# Patient Record
Sex: Female | Born: 1980 | Race: White | Hispanic: No | Marital: Married | Smoking: Never smoker
Health system: Southern US, Community
[De-identification: ages and names within clinical notes are randomized; demographics above are authoritative.]

## PROBLEM LIST (undated history)

## (undated) DIAGNOSIS — J45909 Unspecified asthma, uncomplicated: Secondary | ICD-10-CM

## (undated) DIAGNOSIS — I10 Essential (primary) hypertension: Secondary | ICD-10-CM

## (undated) DIAGNOSIS — M5412 Radiculopathy, cervical region: Secondary | ICD-10-CM

## (undated) DIAGNOSIS — E669 Obesity, unspecified: Secondary | ICD-10-CM

## (undated) DIAGNOSIS — G8929 Other chronic pain: Secondary | ICD-10-CM

## (undated) HISTORY — DX: Other chronic pain: G89.29

## (undated) HISTORY — DX: Essential (primary) hypertension: I10

## (undated) HISTORY — DX: Obesity, unspecified: E66.9

## (undated) HISTORY — DX: Radiculopathy, cervical region: M54.12

## (undated) HISTORY — DX: Unspecified asthma, uncomplicated: J45.909

---

## 2000-03-02 HISTORY — PX: CHOLECYSTECTOMY: SHX55

## 2002-02-01 ENCOUNTER — Encounter: Payer: Self-pay | Admitting: *Deleted

## 2002-02-01 ENCOUNTER — Encounter: Admission: RE | Admit: 2002-02-01 | Discharge: 2002-02-01 | Payer: Self-pay | Admitting: *Deleted

## 2010-01-23 ENCOUNTER — Encounter: Payer: Self-pay | Admitting: Neurology

## 2010-07-26 ENCOUNTER — Ambulatory Visit
Admission: RE | Admit: 2010-07-26 | Discharge: 2010-07-26 | Disposition: A | Discharge status: Home / Self Care | Payer: Medicare Other | Source: Ambulatory Visit | Attending: Specialist | Admitting: Specialist

## 2010-07-26 ENCOUNTER — Other Ambulatory Visit: Payer: Self-pay | Admitting: Specialist

## 2010-07-26 DIAGNOSIS — R52 Pain, unspecified: Secondary | ICD-10-CM

## 2010-07-26 DIAGNOSIS — R609 Edema, unspecified: Secondary | ICD-10-CM

## 2010-07-26 IMAGING — CR DG ANKLE COMPLETE 3+V*L*
3 series · 3 of 3 positions shown · non-contrast
Comparison: None.

CLINICAL DATA: Fell 3 days ago with pain and swelling

LEFT ANKLE COMPLETE - 3+ VIEW

[t ankle joint ap left]
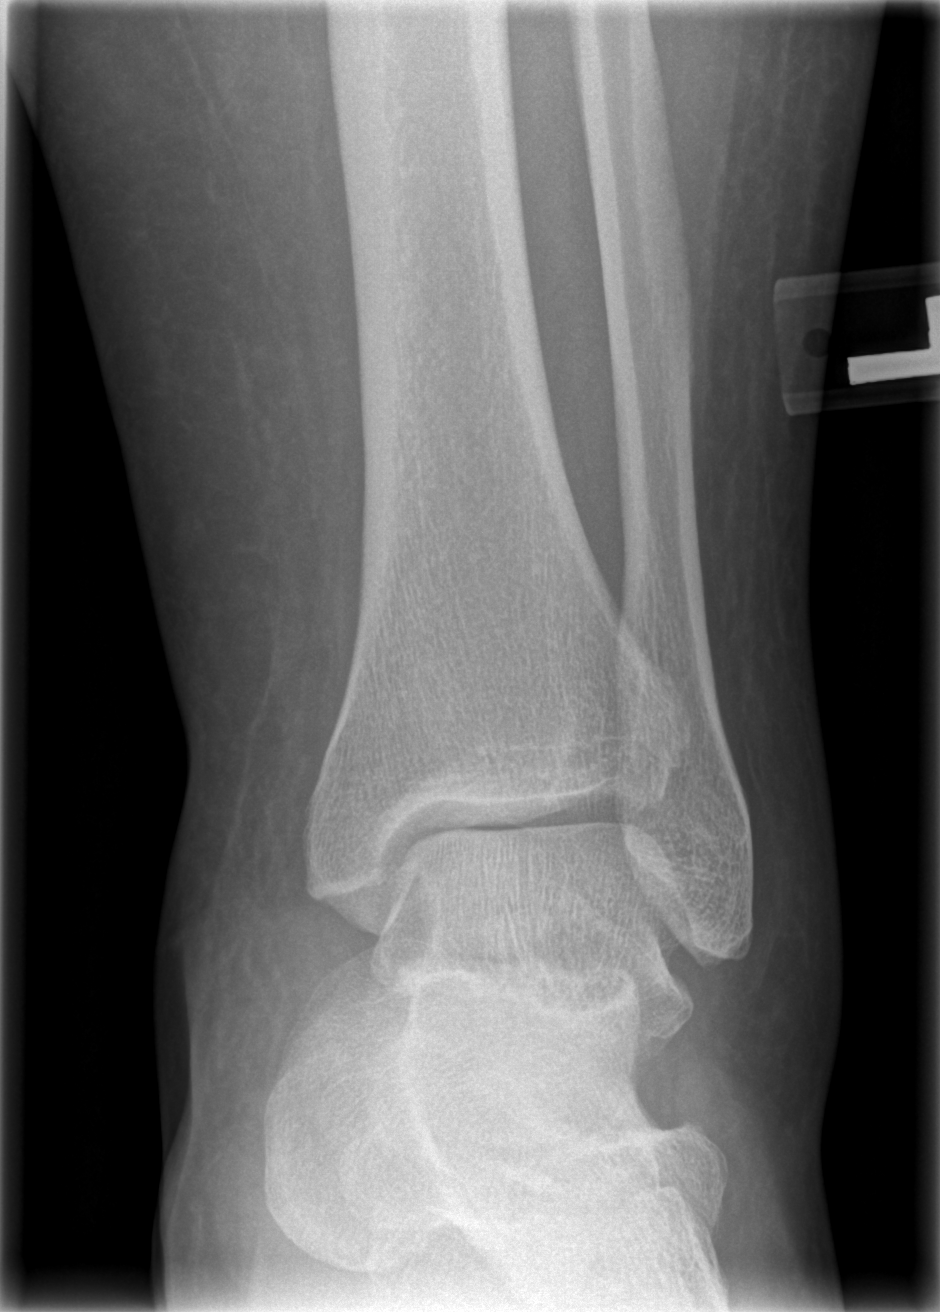

[t ankle joint oblique left]
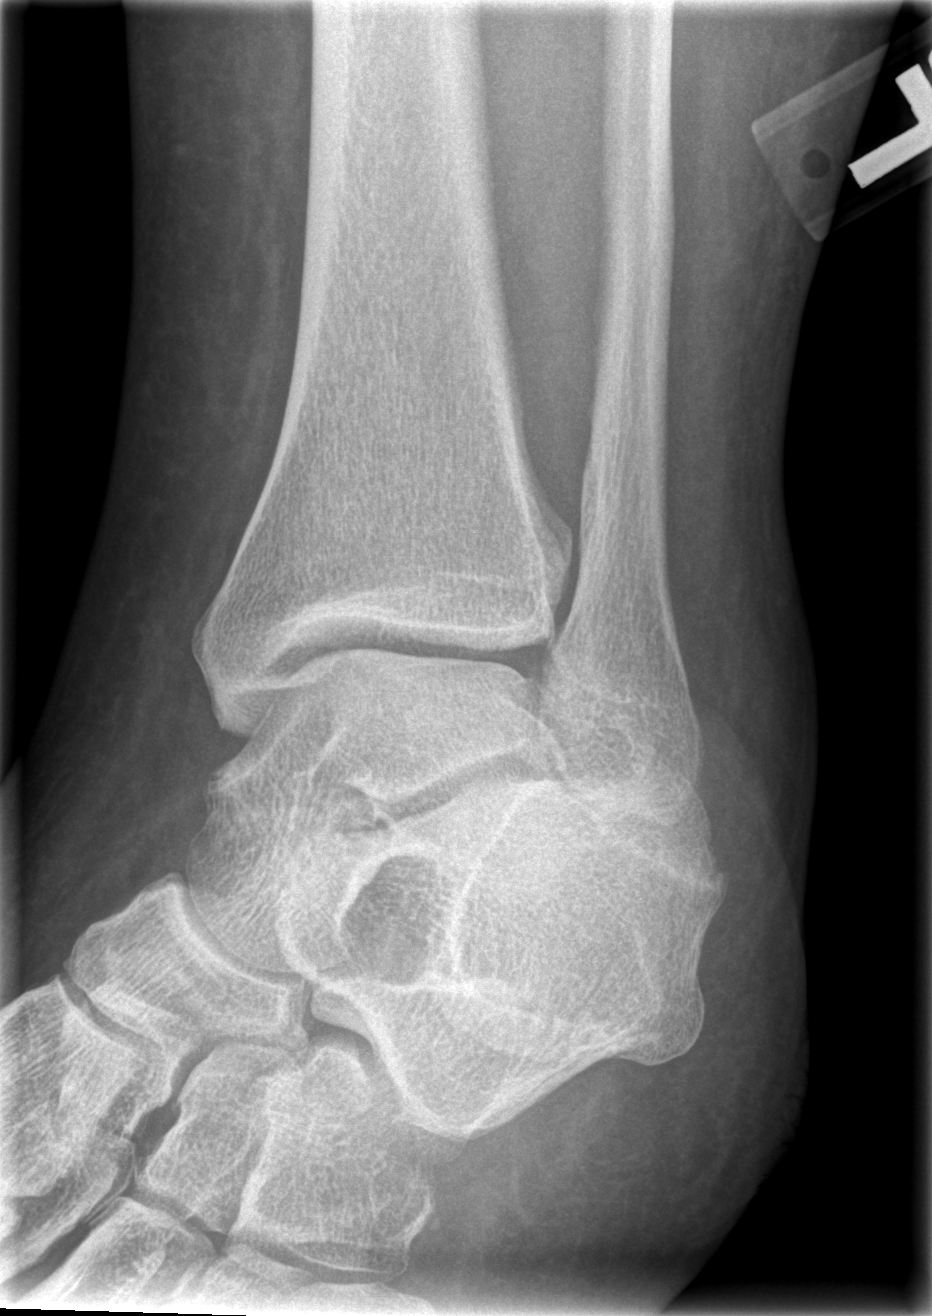

[t ankle joint lat left]
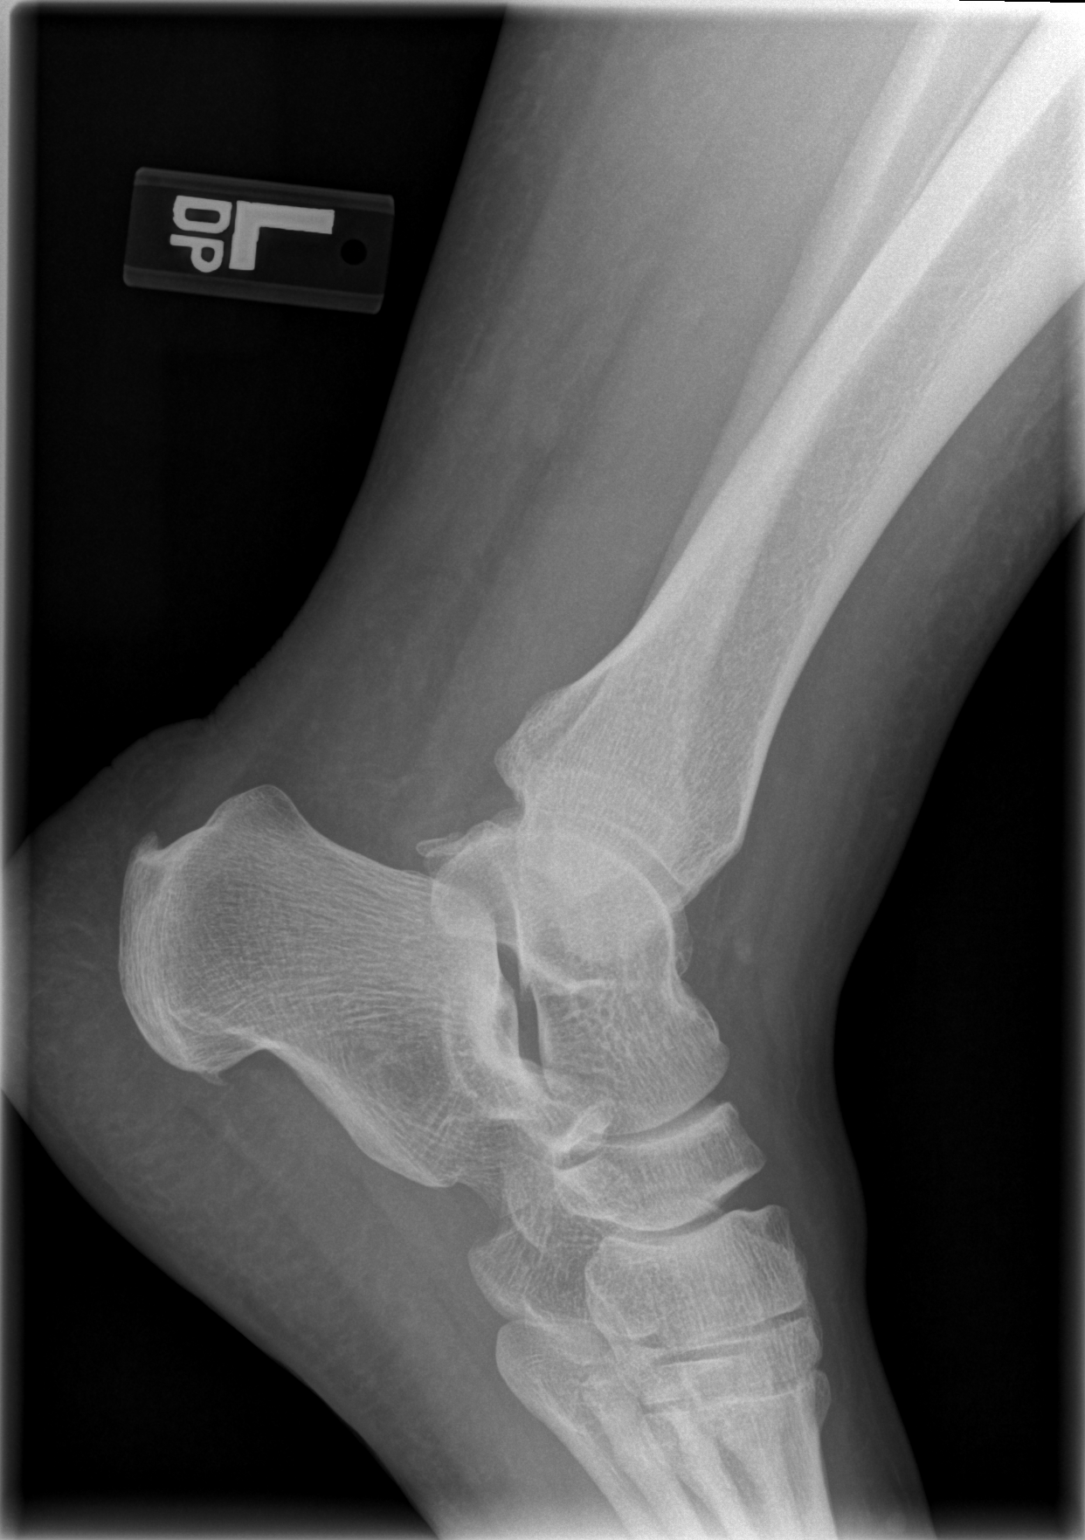

[3 of 3 positions shown; findings below may reference images not displayed]

FINDINGS: The ankle joint appears normal.  Alignment is normal.  No
acute fracture is seen.  Small calcaneal degenerative spurs are
noted.
IMPRESSION: No acute abnormality.

## 2010-07-26 IMAGING — CR DG FOOT COMPLETE 3+V*L*
3 series · 3 of 3 positions shown · non-contrast
Comparison: None.

CLINICAL DATA: Fell 3 years ago with pain

LEFT FOOT - COMPLETE 3+ VIEW

[t foot ap left]
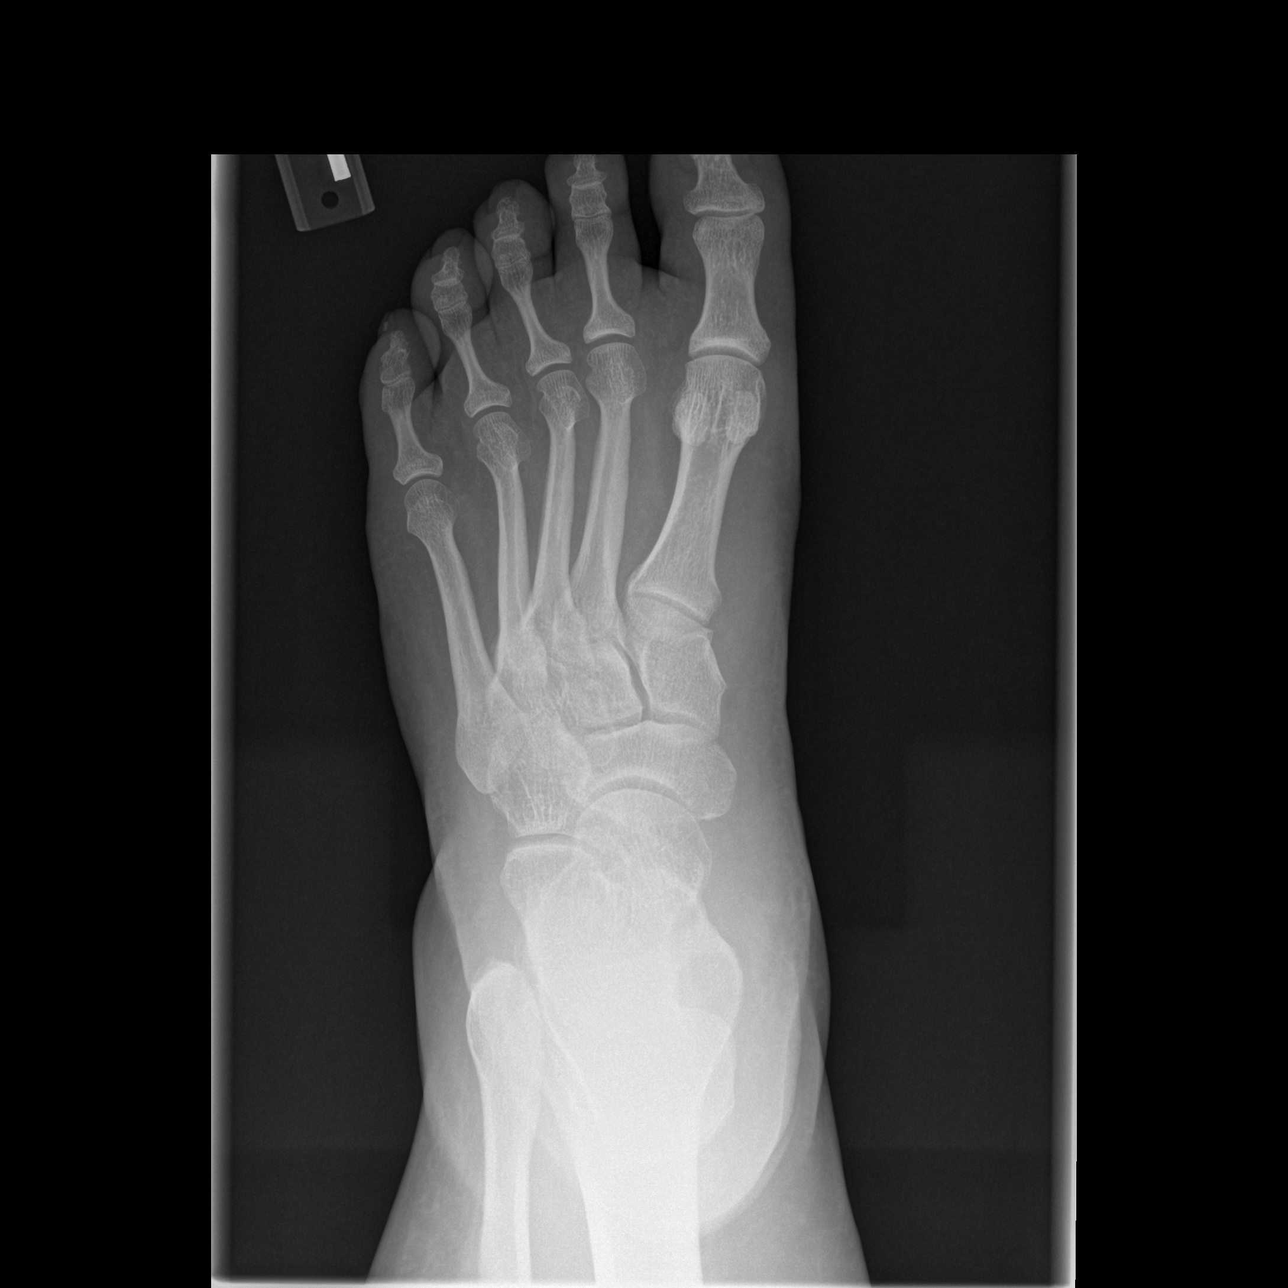

[t foot oblique left]
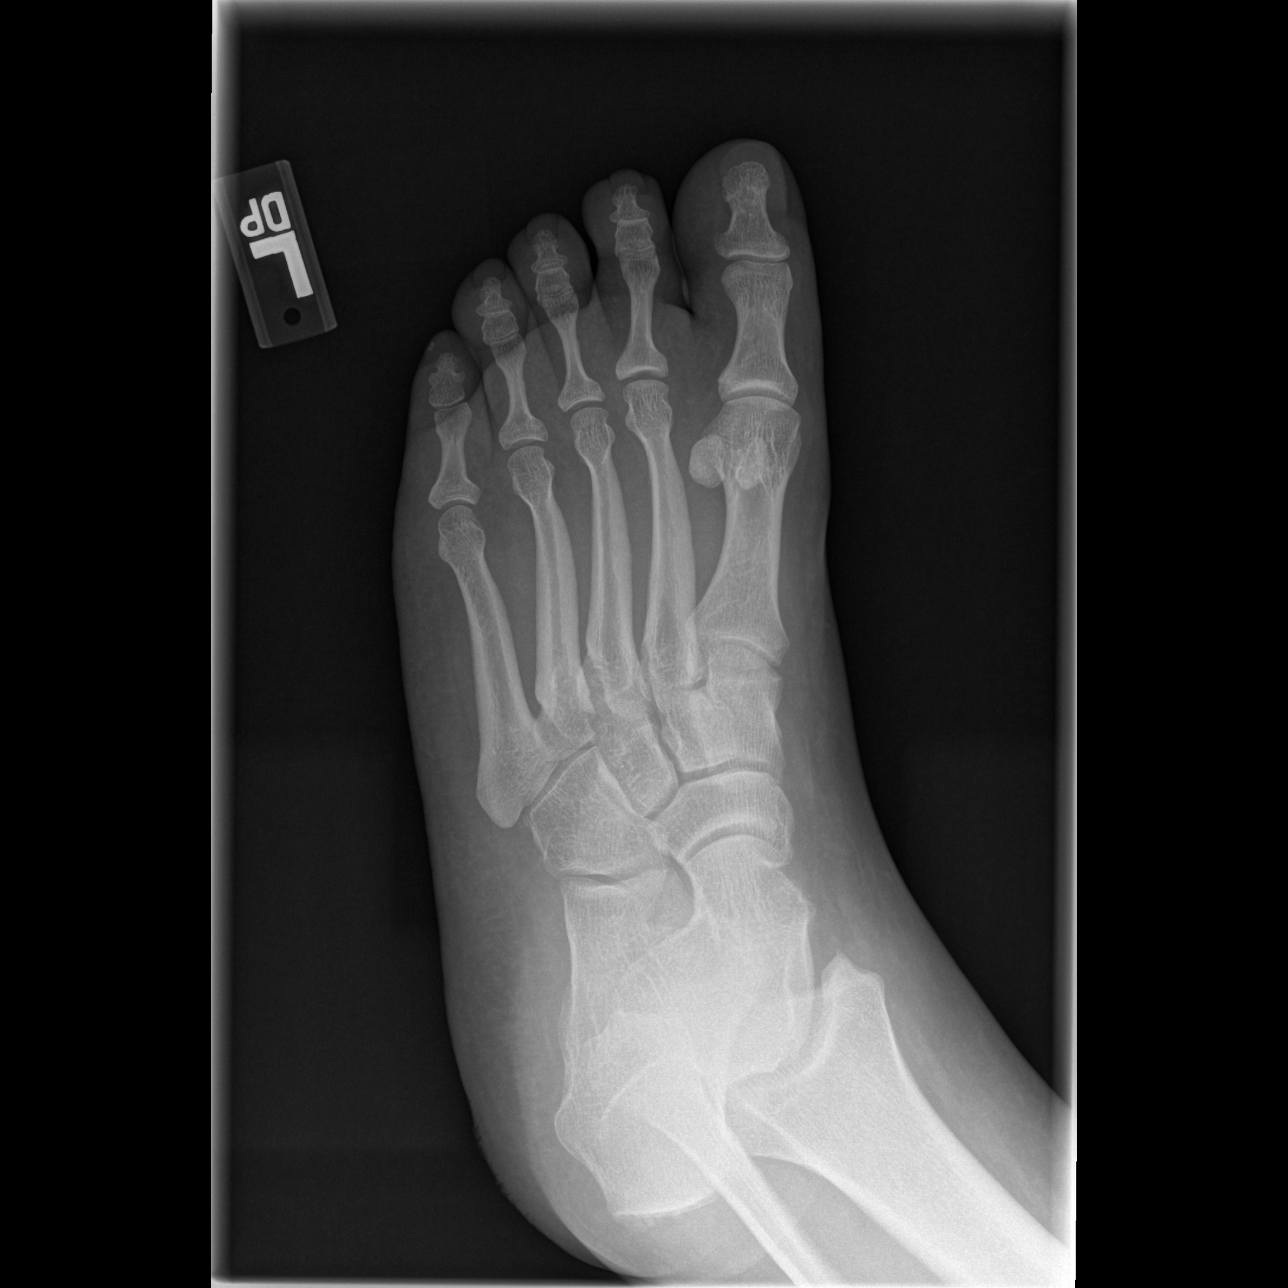

[t foot lat left]
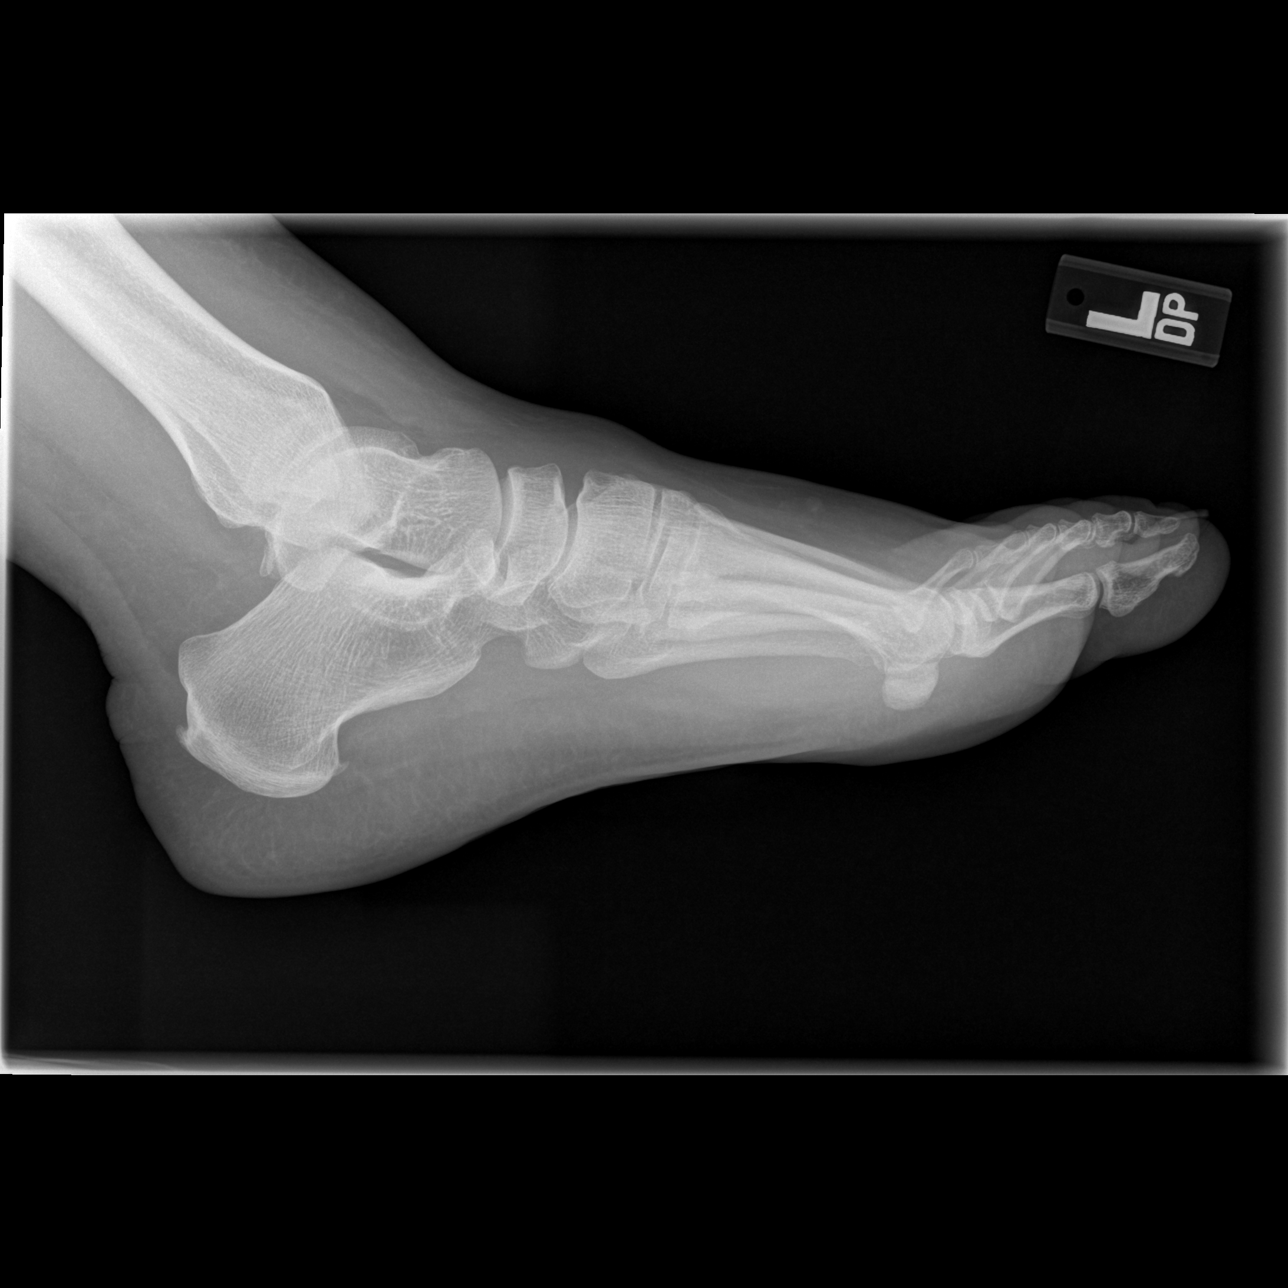

[3 of 3 positions shown; findings below may reference images not displayed]

FINDINGS: Tarsal - metatarsal alignment is normal.  No acute
fracture is seen.  Joint spaces appear normal.
IMPRESSION: Negative left foot.

## 2012-11-04 DIAGNOSIS — M5126 Other intervertebral disc displacement, lumbar region: Secondary | ICD-10-CM | POA: Insufficient documentation

## 2014-04-17 ENCOUNTER — Other Ambulatory Visit (HOSPITAL_COMMUNITY): Payer: Self-pay | Admitting: Physician Assistant

## 2014-04-17 DIAGNOSIS — M5126 Other intervertebral disc displacement, lumbar region: Secondary | ICD-10-CM

## 2014-04-17 DIAGNOSIS — M5020 Other cervical disc displacement, unspecified cervical region: Secondary | ICD-10-CM

## 2014-04-22 ENCOUNTER — Other Ambulatory Visit (HOSPITAL_COMMUNITY): Payer: Self-pay

## 2014-04-24 ENCOUNTER — Ambulatory Visit (HOSPITAL_COMMUNITY): Payer: BLUE CROSS/BLUE SHIELD

## 2014-05-08 ENCOUNTER — Ambulatory Visit (HOSPITAL_COMMUNITY)
Admission: RE | Admit: 2014-05-08 | Discharge: 2014-05-08 | Disposition: A | Discharge status: Home / Self Care | Payer: BLUE CROSS/BLUE SHIELD | Source: Ambulatory Visit | Attending: Physician Assistant | Admitting: Physician Assistant

## 2014-05-08 DIAGNOSIS — M5126 Other intervertebral disc displacement, lumbar region: Secondary | ICD-10-CM

## 2014-05-08 DIAGNOSIS — M5020 Other cervical disc displacement, unspecified cervical region: Secondary | ICD-10-CM

## 2014-06-02 ENCOUNTER — Ambulatory Visit (HOSPITAL_COMMUNITY): Payer: Self-pay

## 2014-06-02 ENCOUNTER — Other Ambulatory Visit (HOSPITAL_COMMUNITY): Payer: Self-pay | Admitting: Internal Medicine

## 2014-06-02 DIAGNOSIS — M5020 Other cervical disc displacement, unspecified cervical region: Secondary | ICD-10-CM

## 2014-06-04 ENCOUNTER — Ambulatory Visit (HOSPITAL_COMMUNITY): Payer: BLUE CROSS/BLUE SHIELD

## 2014-06-09 ENCOUNTER — Ambulatory Visit (HOSPITAL_COMMUNITY)
Admission: RE | Admit: 2014-06-09 | Discharge: 2014-06-09 | Disposition: A | Discharge status: Home / Self Care | Payer: BLUE CROSS/BLUE SHIELD | Source: Ambulatory Visit | Attending: Internal Medicine | Admitting: Internal Medicine

## 2014-06-09 DIAGNOSIS — M542 Cervicalgia: Secondary | ICD-10-CM | POA: Insufficient documentation

## 2014-06-09 DIAGNOSIS — M5020 Other cervical disc displacement, unspecified cervical region: Secondary | ICD-10-CM

## 2014-06-23 ENCOUNTER — Other Ambulatory Visit (HOSPITAL_COMMUNITY): Payer: Self-pay | Admitting: Internal Medicine

## 2014-06-23 DIAGNOSIS — T18128A Food in esophagus causing other injury, initial encounter: Secondary | ICD-10-CM

## 2014-06-23 DIAGNOSIS — R1310 Dysphagia, unspecified: Secondary | ICD-10-CM

## 2014-06-23 DIAGNOSIS — K222 Esophageal obstruction: Secondary | ICD-10-CM

## 2014-06-26 ENCOUNTER — Inpatient Hospital Stay (HOSPITAL_COMMUNITY): Admission: RE | Admit: 2014-06-26 | Payer: Self-pay | Source: Ambulatory Visit

## 2014-06-30 ENCOUNTER — Ambulatory Visit (HOSPITAL_COMMUNITY)
Admission: RE | Admit: 2014-06-30 | Discharge: 2014-06-30 | Disposition: A | Discharge status: Home / Self Care | Payer: BLUE CROSS/BLUE SHIELD | Source: Ambulatory Visit | Attending: Internal Medicine | Admitting: Internal Medicine

## 2014-06-30 DIAGNOSIS — R1310 Dysphagia, unspecified: Secondary | ICD-10-CM

## 2014-06-30 DIAGNOSIS — T18128A Food in esophagus causing other injury, initial encounter: Secondary | ICD-10-CM | POA: Insufficient documentation

## 2014-06-30 DIAGNOSIS — K222 Esophageal obstruction: Secondary | ICD-10-CM | POA: Diagnosis not present

## 2014-07-03 ENCOUNTER — Other Ambulatory Visit (HOSPITAL_COMMUNITY): Payer: Self-pay | Admitting: Internal Medicine

## 2014-07-03 ENCOUNTER — Ambulatory Visit (HOSPITAL_COMMUNITY)
Admission: RE | Admit: 2014-07-03 | Discharge: 2014-07-03 | Disposition: A | Discharge status: Home / Self Care | Payer: BLUE CROSS/BLUE SHIELD | Source: Ambulatory Visit | Attending: Internal Medicine | Admitting: Internal Medicine

## 2014-07-03 DIAGNOSIS — R0600 Dyspnea, unspecified: Secondary | ICD-10-CM

## 2014-07-03 DIAGNOSIS — R0602 Shortness of breath: Secondary | ICD-10-CM | POA: Diagnosis present

## 2014-07-03 DIAGNOSIS — R079 Chest pain, unspecified: Secondary | ICD-10-CM | POA: Diagnosis not present

## 2014-07-22 ENCOUNTER — Ambulatory Visit: Payer: Self-pay | Admitting: Cardiology

## 2014-07-31 ENCOUNTER — Ambulatory Visit: Payer: Self-pay | Admitting: Cardiology

## 2014-08-10 ENCOUNTER — Other Ambulatory Visit: Payer: Self-pay | Admitting: *Deleted

## 2014-08-10 ENCOUNTER — Encounter: Payer: Self-pay | Admitting: *Deleted

## 2014-08-11 ENCOUNTER — Encounter: Payer: Self-pay | Admitting: Cardiology

## 2014-08-11 NOTE — Progress Notes (Signed)
Encounter opened in error

## 2014-10-01 ENCOUNTER — Other Ambulatory Visit (HOSPITAL_COMMUNITY): Payer: Self-pay | Admitting: Internal Medicine

## 2014-10-01 DIAGNOSIS — G44209 Tension-type headache, unspecified, not intractable: Secondary | ICD-10-CM

## 2014-10-07 ENCOUNTER — Ambulatory Visit (HOSPITAL_COMMUNITY): Payer: BLUE CROSS/BLUE SHIELD

## 2014-10-13 ENCOUNTER — Ambulatory Visit (HOSPITAL_COMMUNITY): Payer: BLUE CROSS/BLUE SHIELD

## 2014-11-03 ENCOUNTER — Ambulatory Visit (HOSPITAL_COMMUNITY): Payer: BLUE CROSS/BLUE SHIELD

## 2014-11-17 ENCOUNTER — Ambulatory Visit (HOSPITAL_COMMUNITY): Payer: BLUE CROSS/BLUE SHIELD

## 2014-11-24 ENCOUNTER — Ambulatory Visit (HOSPITAL_COMMUNITY)
Admission: RE | Admit: 2014-11-24 | Discharge: 2014-11-24 | Disposition: A | Discharge status: Home / Self Care | Payer: BLUE CROSS/BLUE SHIELD | Source: Ambulatory Visit | Attending: Internal Medicine | Admitting: Internal Medicine

## 2014-11-24 DIAGNOSIS — G44209 Tension-type headache, unspecified, not intractable: Secondary | ICD-10-CM

## 2014-11-24 DIAGNOSIS — D1779 Benign lipomatous neoplasm of other sites: Secondary | ICD-10-CM | POA: Insufficient documentation

## 2014-11-24 DIAGNOSIS — R51 Headache: Secondary | ICD-10-CM | POA: Insufficient documentation

## 2015-01-08 ENCOUNTER — Ambulatory Visit: Payer: Self-pay | Admitting: Neurology

## 2016-04-21 DIAGNOSIS — J45909 Unspecified asthma, uncomplicated: Secondary | ICD-10-CM | POA: Insufficient documentation

## 2016-04-21 DIAGNOSIS — M797 Fibromyalgia: Secondary | ICD-10-CM | POA: Insufficient documentation

## 2017-03-27 ENCOUNTER — Ambulatory Visit: Payer: BLUE CROSS/BLUE SHIELD | Admitting: Neurology

## 2017-03-27 ENCOUNTER — Telehealth: Payer: Self-pay | Admitting: Neurology

## 2017-03-27 NOTE — Telephone Encounter (Signed)
This patient did not show for a new patient appointment today. 

## 2017-03-28 ENCOUNTER — Encounter: Payer: Self-pay | Admitting: Neurology

## 2017-04-18 ENCOUNTER — Telehealth: Payer: Self-pay | Admitting: Neurology

## 2017-04-18 ENCOUNTER — Ambulatory Visit: Payer: BLUE CROSS/BLUE SHIELD | Admitting: Neurology

## 2017-04-18 NOTE — Telephone Encounter (Signed)
This patient did not show for a new patient appointment today. 

## 2017-06-23 IMAGING — US UPPER LEFT VENOUS EXTREMITY ULTRASOUND
1 series · 13 of 24 positions shown · non-contrast
Comparison: None.

CLINICAL DATA: 37-year-old female with left arm swelling



[Series 1: upper left venous extremity ultrasound · 0.08mm/px · 13 of 72 slices shown]
[im 1/72]
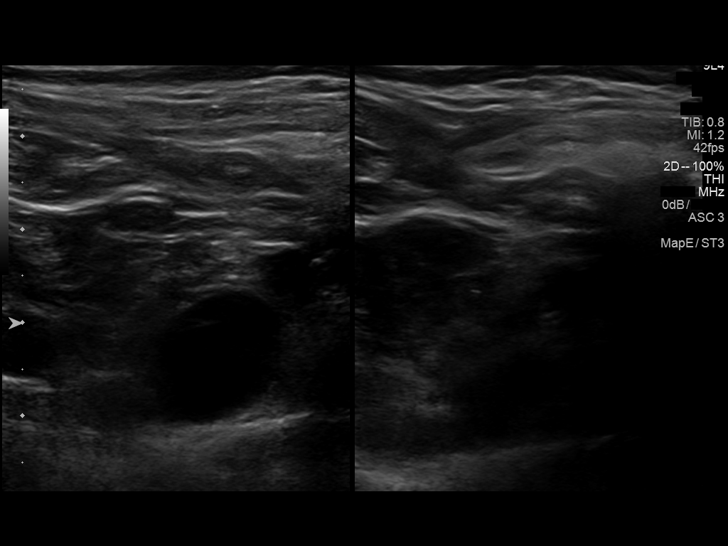
[im 7/72]
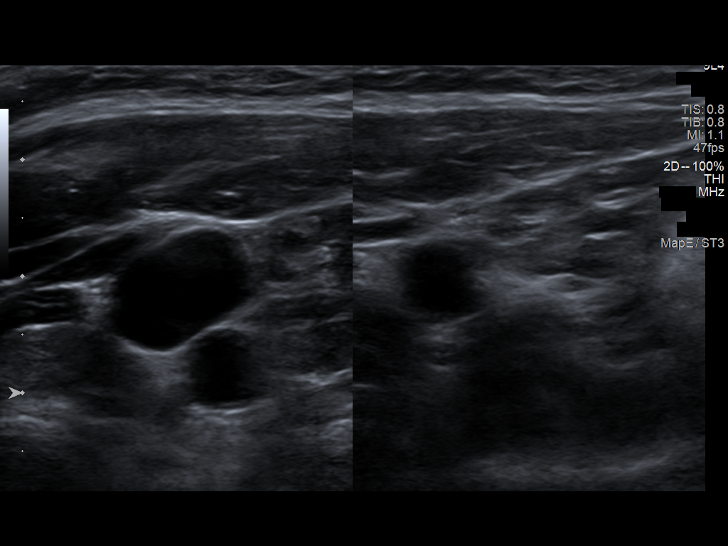
[im 13/72]
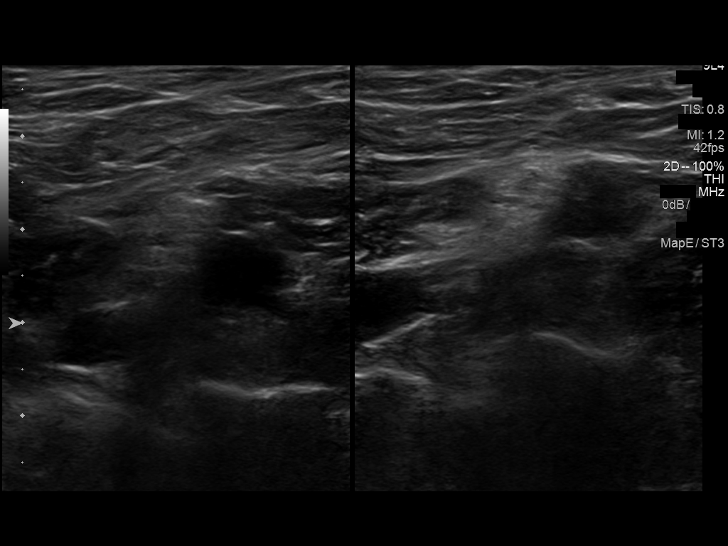
[im 19/72]
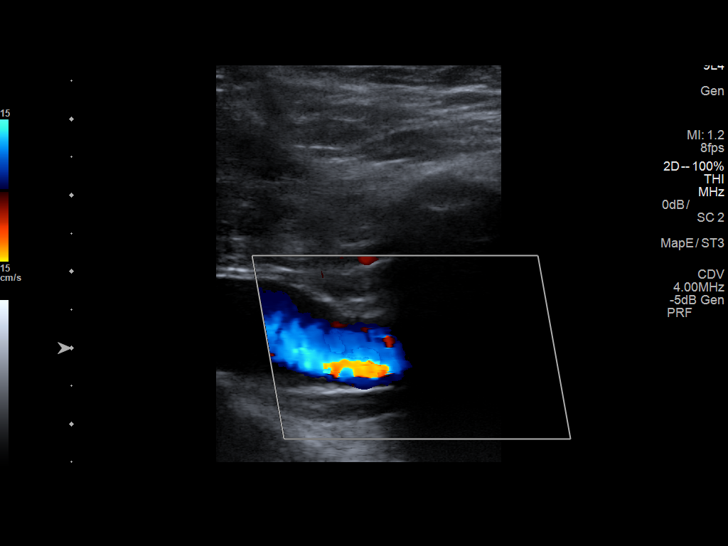
[im 25/72]
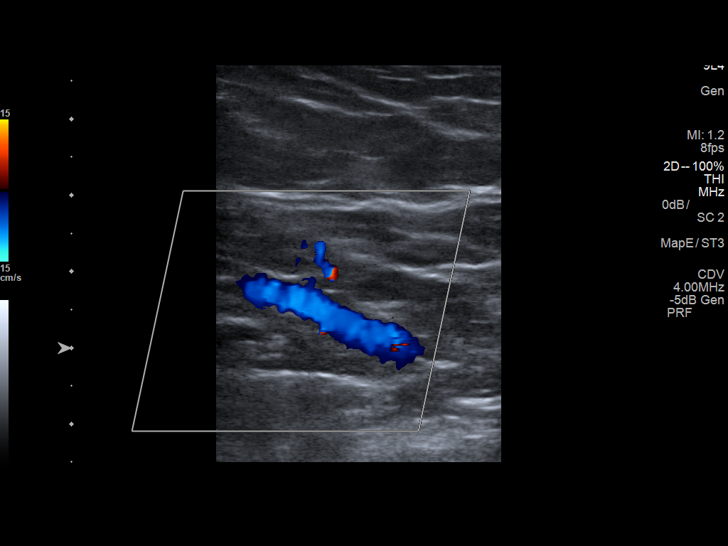
[im 31/72]
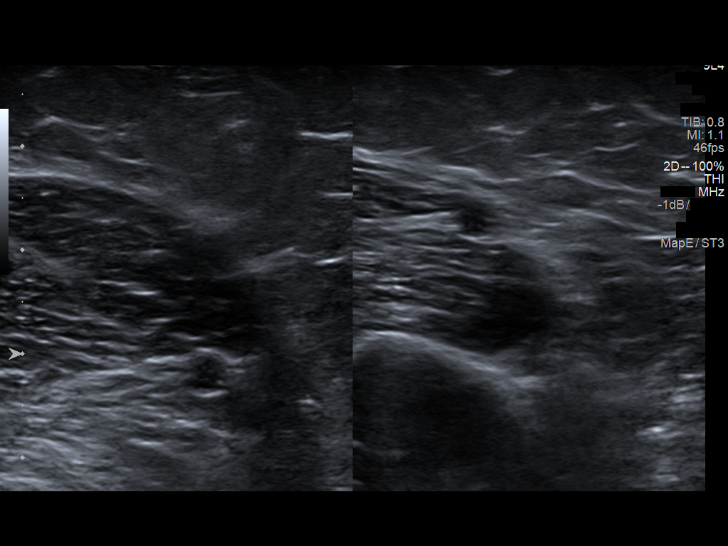
[im 38/72]
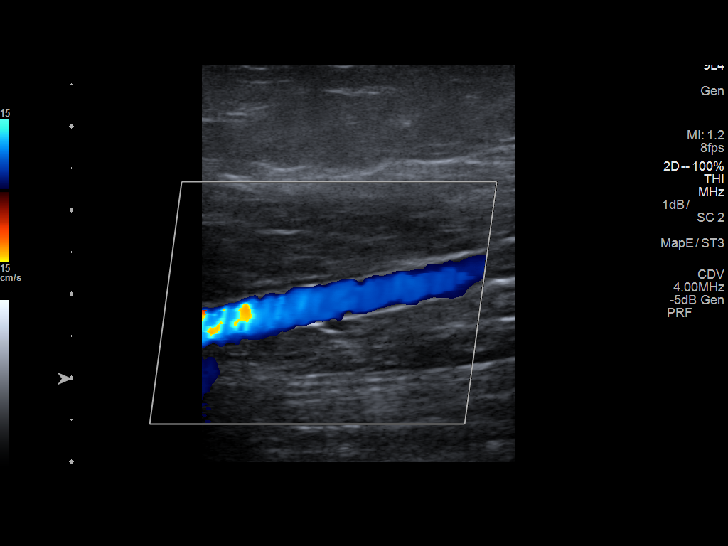
[im 41/72]
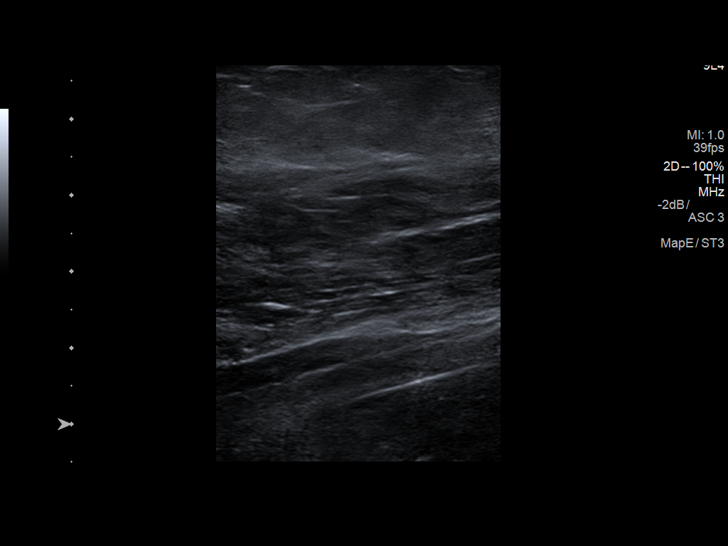
[im 47/72]
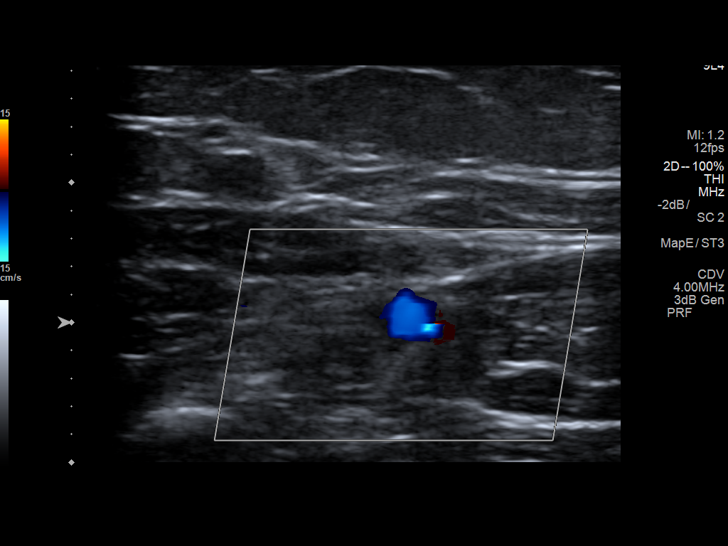
[im 53/72]
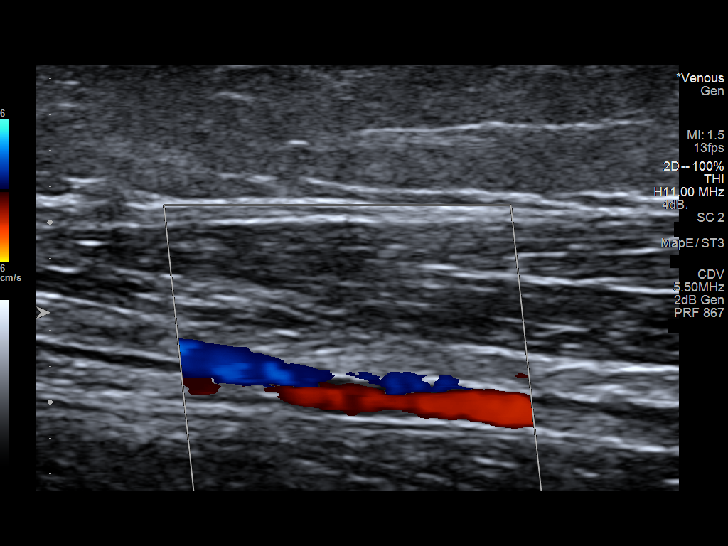
[im 59/72]
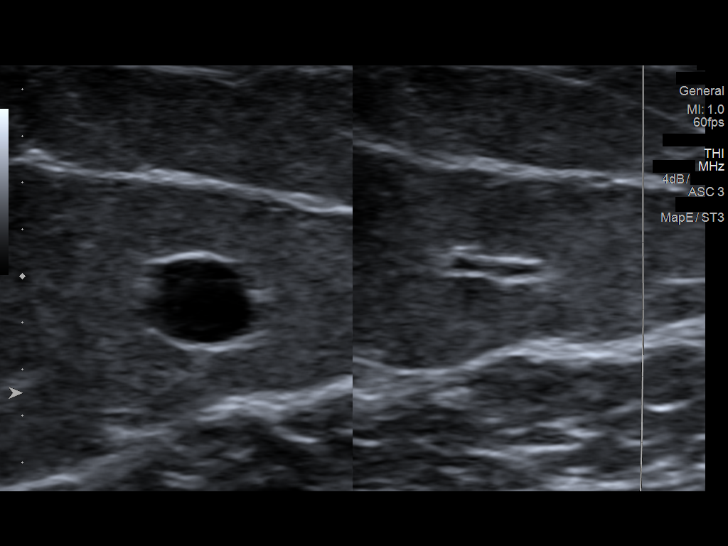
[im 65/72]
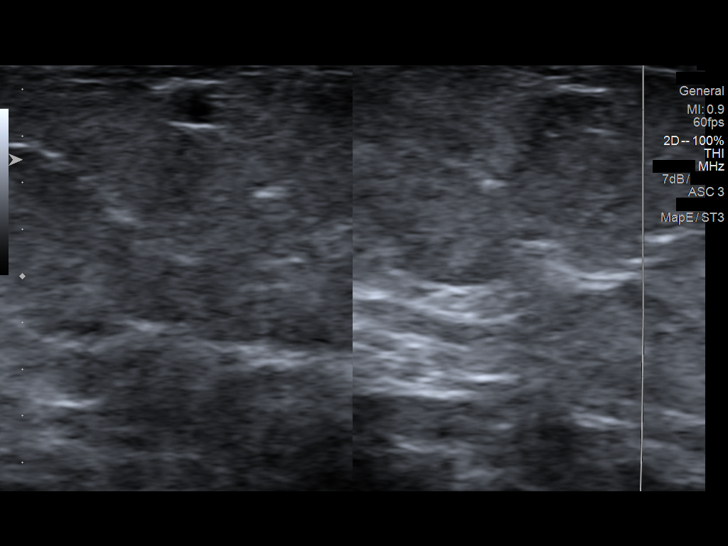
[im 72/72]
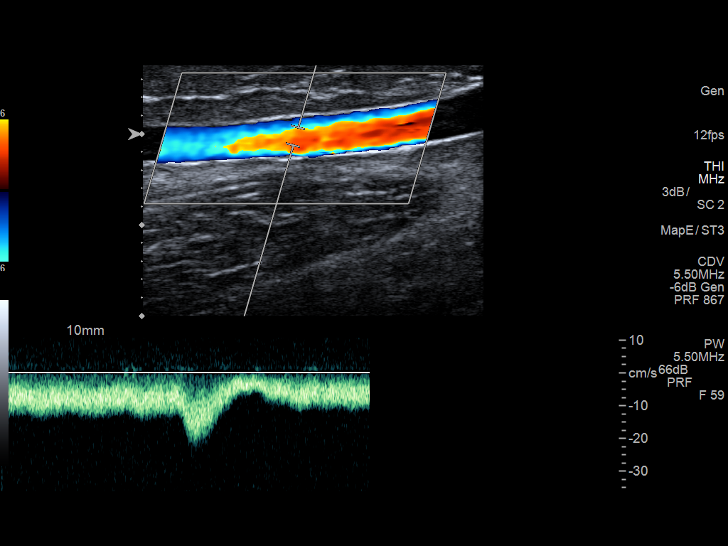

[13 of 24 positions shown; findings below may reference images not displayed]

FINDINGS: Contralateral Subclavian Vein: Respiratory phasicity is normal and
symmetric with the symptomatic side. No evidence of thrombus. Normal
compressibility.

Internal Jugular Vein: No evidence of thrombus. Normal
compressibility, respiratory phasicity and response to augmentation.

Subclavian Vein: No evidence of thrombus. Normal compressibility,
respiratory phasicity and response to augmentation.

Axillary Vein: No evidence of thrombus. Normal compressibility,
respiratory phasicity and response to augmentation.

Cephalic Vein: No evidence of thrombus. Normal compressibility,
respiratory phasicity and response to augmentation.

Basilic Vein: No evidence of thrombus. Normal compressibility,
respiratory phasicity and response to augmentation.

Brachial Veins: No evidence of thrombus. Normal compressibility,
respiratory phasicity and response to augmentation.

Radial Veins: No evidence of thrombus. Normal compressibility,
respiratory phasicity and response to augmentation.

Ulnar Veins: No evidence of thrombus. Normal compressibility,
respiratory phasicity and response to augmentation.

Other Findings:  None visualized.
IMPRESSION: Sonographic survey of the left upper extremity negative for DVT

## 2017-11-26 ENCOUNTER — Ambulatory Visit (HOSPITAL_COMMUNITY)
Admission: RE | Admit: 2017-11-26 | Discharge: 2017-11-26 | Disposition: A | Discharge status: Home / Self Care | Payer: BLUE CROSS/BLUE SHIELD | Source: Ambulatory Visit | Attending: Internal Medicine | Admitting: Internal Medicine

## 2017-11-26 ENCOUNTER — Other Ambulatory Visit (HOSPITAL_COMMUNITY): Payer: Self-pay | Admitting: Internal Medicine

## 2017-11-26 DIAGNOSIS — R05 Cough: Secondary | ICD-10-CM

## 2017-11-26 DIAGNOSIS — R059 Cough, unspecified: Secondary | ICD-10-CM

## 2017-11-26 IMAGING — DX DG CHEST 2V
2 series · 2 of 2 positions shown · non-contrast
Comparison: [DATE]

CLINICAL DATA: Chronic cough and shortness of breath for several
months

EXAM:
CHEST - 2 VIEW

[chest pa]
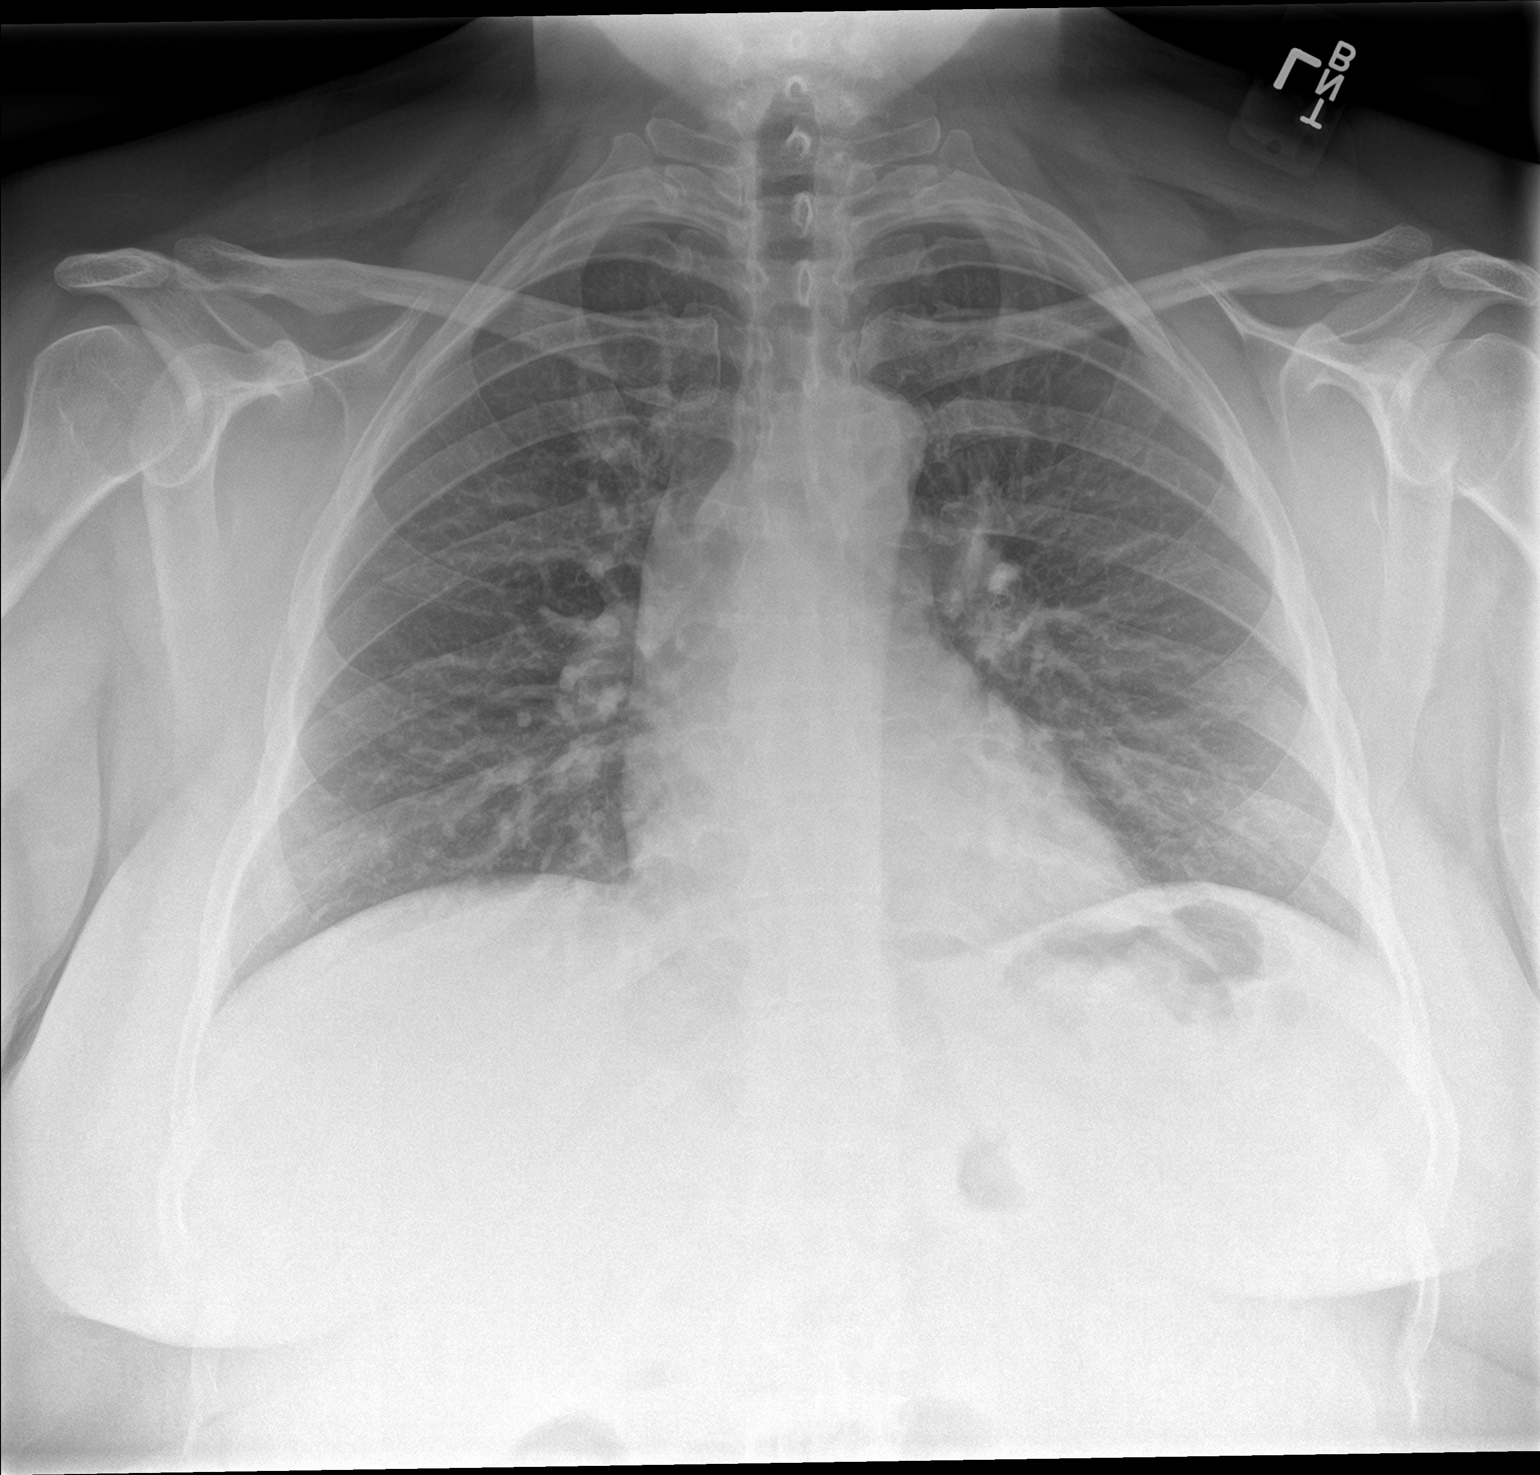

[chest lat]
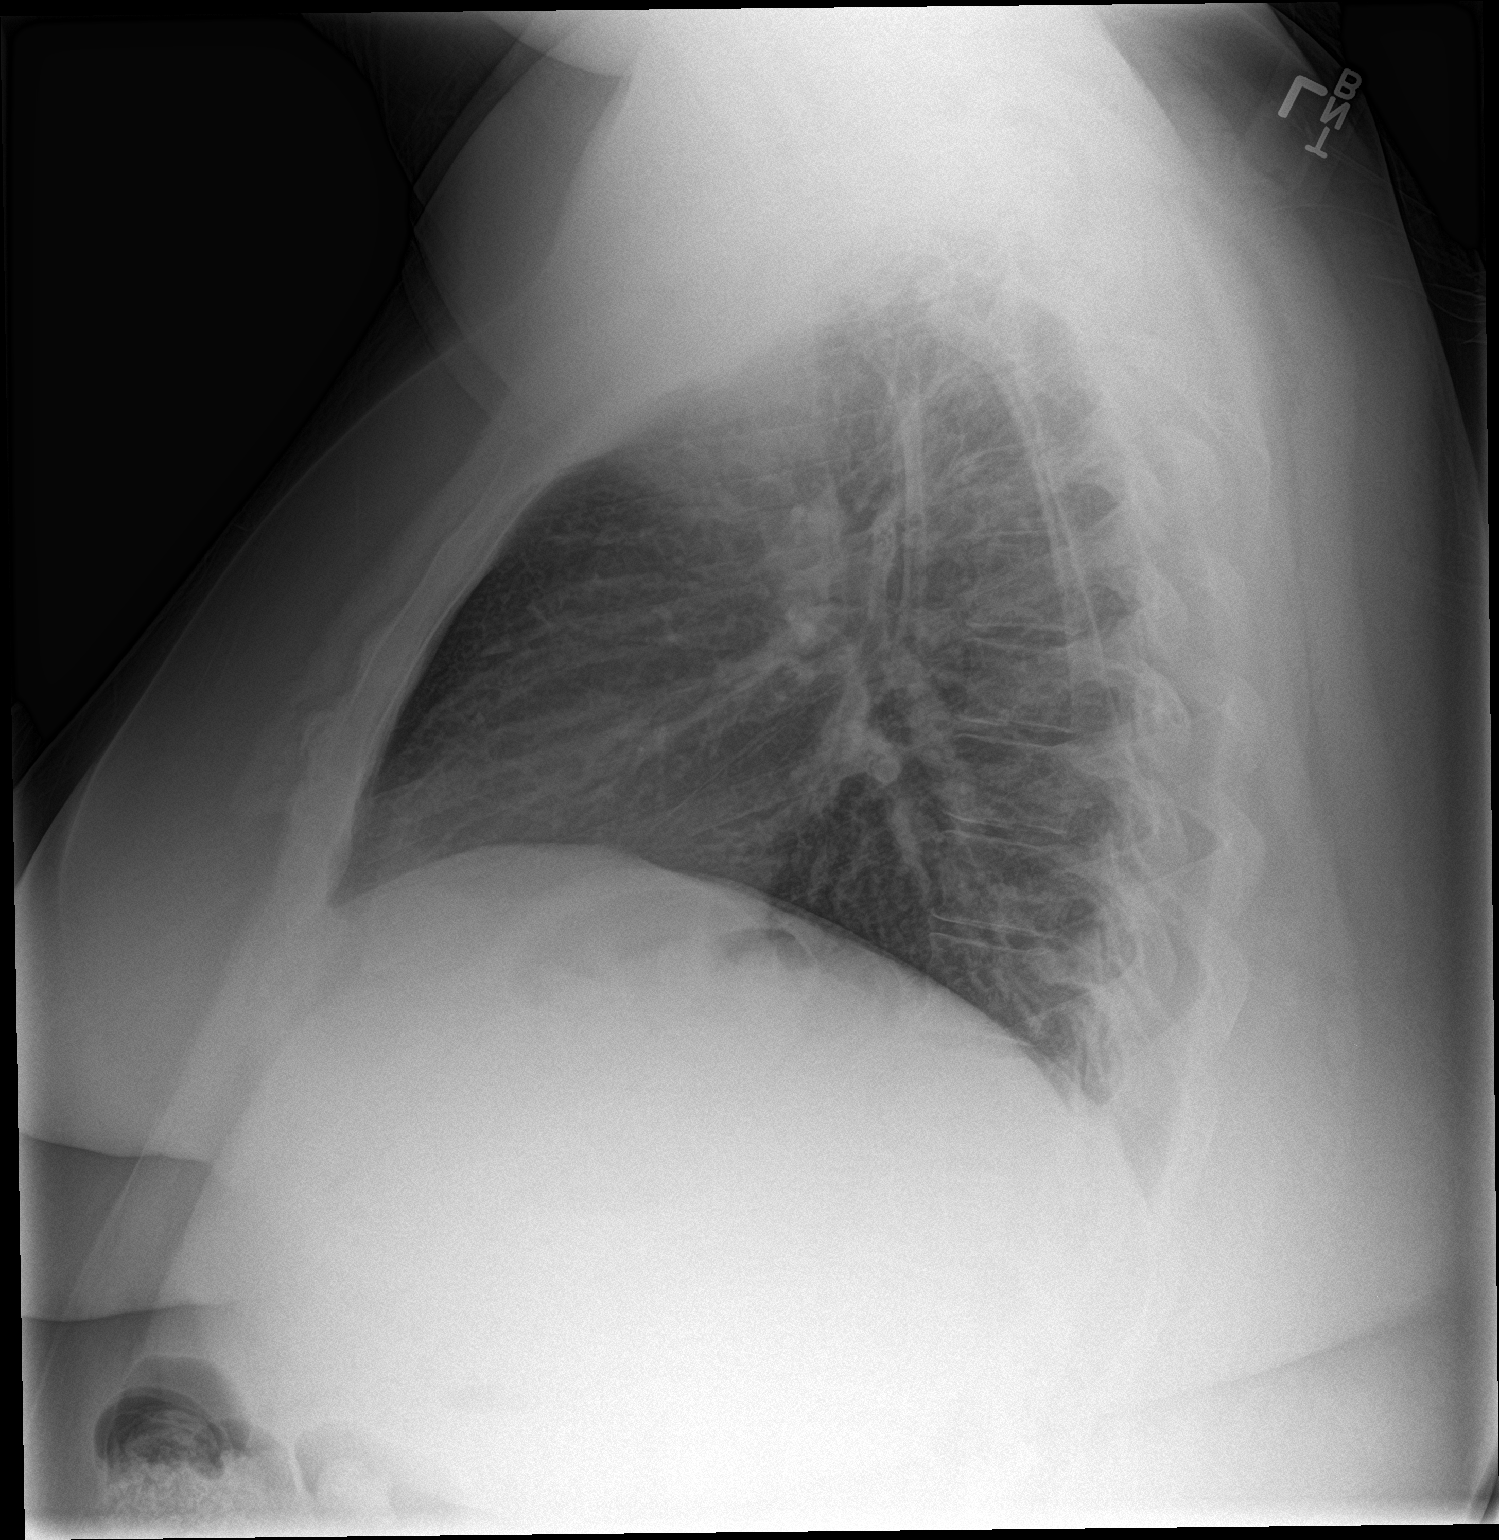

[2 of 2 positions shown; findings below may reference images not displayed]

FINDINGS: Cardiac shadow is within normal limits. The lungs are well aerated
bilaterally. No focal infiltrate or sizable effusion is seen. No
acute bony abnormality is noted.
IMPRESSION: No acute abnormality seen.

## 2018-05-01 ENCOUNTER — Ambulatory Visit (HOSPITAL_COMMUNITY)
Admission: RE | Admit: 2018-05-01 | Discharge: 2018-05-01 | Disposition: A | Discharge status: Home / Self Care | Payer: BLUE CROSS/BLUE SHIELD | Source: Ambulatory Visit | Attending: Internal Medicine | Admitting: Internal Medicine

## 2018-05-01 ENCOUNTER — Other Ambulatory Visit: Payer: Self-pay | Admitting: Emergency Medicine

## 2018-05-01 ENCOUNTER — Other Ambulatory Visit (HOSPITAL_COMMUNITY): Payer: Self-pay | Admitting: Internal Medicine

## 2018-05-01 ENCOUNTER — Other Ambulatory Visit: Payer: Self-pay | Admitting: Internal Medicine

## 2018-05-01 ENCOUNTER — Other Ambulatory Visit: Payer: Self-pay | Admitting: Neurosurgery

## 2018-05-01 ENCOUNTER — Other Ambulatory Visit: Payer: Self-pay

## 2018-05-01 DIAGNOSIS — M79602 Pain in left arm: Secondary | ICD-10-CM | POA: Diagnosis present

## 2018-05-28 ENCOUNTER — Telehealth: Payer: Self-pay | Admitting: Diagnostic Neuroimaging

## 2018-05-28 NOTE — Telephone Encounter (Signed)
°  Due to current COVID 19 pandemic, our office is severely reducing in office visits until further notice, in order to minimize the risk to our patients and healthcare providers.    Called patient and scheduled a virtual visit with Dr. Leta Baptist for 6/01. Patient verbalized understanding of the [email or web address] process and I have sent an e-mail to [email or web address] with link and instructions as well as my name and our office number. Patient understands that they will receive a call from RN to update chart.   Pt understands that although there may be some limitations with this type of visit, we will take all precautions to reduce any security or privacy concerns.  Pt understands that this will be treated like an in office visit and we will file with pt's insurance, and there may be a patient responsible charge related to this service.

## 2018-05-29 ENCOUNTER — Encounter: Payer: Self-pay | Admitting: Diagnostic Neuroimaging

## 2018-05-29 ENCOUNTER — Other Ambulatory Visit: Payer: Self-pay | Admitting: *Deleted

## 2018-05-29 NOTE — Telephone Encounter (Signed)
Attempted to reach patient to update EMR. No answer and voice MB full.

## 2018-06-03 ENCOUNTER — Ambulatory Visit (INDEPENDENT_AMBULATORY_CARE_PROVIDER_SITE_OTHER): Payer: BLUE CROSS/BLUE SHIELD | Admitting: Diagnostic Neuroimaging

## 2018-06-03 ENCOUNTER — Other Ambulatory Visit: Payer: Self-pay

## 2018-06-03 ENCOUNTER — Encounter: Payer: Self-pay | Admitting: Diagnostic Neuroimaging

## 2018-06-03 DIAGNOSIS — G43109 Migraine with aura, not intractable, without status migrainosus: Secondary | ICD-10-CM

## 2018-06-03 DIAGNOSIS — D179 Benign lipomatous neoplasm, unspecified: Secondary | ICD-10-CM | POA: Diagnosis not present

## 2018-06-03 MED ORDER — GALCANEZUMAB-GNLM 120 MG/ML ~~LOC~~ SOAJ: 120.0000 mg | SUBCUTANEOUS | 4 refills | Status: DC

## 2018-06-03 MED ORDER — RIZATRIPTAN BENZOATE 10 MG PO TBDP: 10.0000 mg | ORAL_TABLET | ORAL | 11 refills | Status: AC | PRN

## 2018-06-03 NOTE — Progress Notes (Signed)
[facility]  PATIENT: Andrea Zamora DOB: 20-Nov-1980  REFERRING CLINICIAN: Fusco HISTORY FROM: patient  REASON FOR VISIT: new consult    HISTORICAL  CHIEF COMPLAINT:  Chief Complaint  Patient presents with  . Migraine    HISTORY OF PRESENT ILLNESS:   38 year old female here for evaluation of headaches.  Age 49 years old patient was pregnant and developed headaches.  She describes global throbbing headaches associated with nausea, sensitivity light and sound, ringing in ears, loss of vision.  Patient was diagnosed with migraine headaches with aura and started on medication after pregnancy.  Patient has tried Topamax, Fioricet, propranolol, amitriptyline, nortriptyline, Imitrex, Maxalt, Botox, trigger point injections without relief.  Patient has photophobia, photophobia, sensitivity to temperature.  Patient now having migraine headaches 3-4 times per week.  Patient also has had issues with bulging disc and pinched nerves in her neck and low back.    She has a remote history of seizures which have resolved.    REVIEW OF SYSTEMS: Full 14 system review of systems performed and negative with exception of: As per HPI.  ALLERGIES: Allergies  Allergen Reactions  . Pregabalin Swelling  . Hydrocodone-Acetaminophen   . Nucynta  [Tapentadol Hcl]   . Sulfamethoxazole-Trimethoprim   . Metoclopramide Itching and Rash  . Nitrofurantoin Itching  . Prochlorperazine Edisylate Itching and Rash  . Tramadol Other (See Comments)    Stomach pain     HOME MEDICATIONS: Outpatient Medications Prior to Visit  Medication Sig Dispense Refill  . albuterol (VENTOLIN HFA) 108 (90 Base) MCG/ACT inhaler Inhale into the lungs every 6 (six) hours as needed.    . busPIRone (BUSPAR) 10 MG tablet Take 10 mg by mouth 2 (two) times a day.    Marland Kitchen DOXYCYCLINE MONOHYDRATE PO Take 50 mg by mouth daily.    . ferrous sulfate 325 (65 FE) MG tablet Take 325 mg by mouth daily with breakfast.     . gabapentin (NEURONTIN) 600 MG tablet Take 600 mg by mouth 3 (three) times daily.    . montelukast (SINGULAIR) 10 MG tablet 10 mg daily.    . norethindrone-ethinyl estradiol 1/35 (ORTHO-NOVUM) tablet Take 1 tablet by mouth daily.    Marland Kitchen oxyCODONE (OXYCONTIN) 15 mg 12 hr tablet Take 15 mg by mouth every 12 (twelve) hours.    . phentermine 37.5 MG capsule Take 37.5 mg by mouth every morning.    Marland Kitchen albuterol (PROVENTIL) 2 MG tablet Take 2 mg by mouth 3 (three) times daily.  11  . ALPRAZolam (XANAX) 0.5 MG tablet Take 1 tablet by mouth 3 (three) times daily as needed.  2  . Butalbital-APAP-Caffeine 50-325-40 MG capsule Take 1 capsule by mouth every 6 (six) hours. As needed    . oxyCODONE-acetaminophen (PERCOCET) 10-325 MG per tablet Take 1-2 tablets by mouth every 6 (six) hours as needed.  0  . pantoprazole (PROTONIX) 40 MG tablet Take 40 mg by mouth daily.  11  . topiramate (TOPAMAX) 25 MG tablet Take 25 mg by mouth 2 (two) times daily.  11  . valACYclovir (VALTREX) 500 MG tablet Take 500 mg by mouth daily.  11   No facility-administered medications prior to visit.     PAST MEDICAL HISTORY: Past Medical History:  Diagnosis Date  . Asthma   . Cervical radiculitis   . Chronic pain   . Hypertension   . Obesity     PAST SURGICAL HISTORY: History reviewed. No pertinent surgical history.  FAMILY HISTORY: Family History  Problem Relation  Age of Onset  . Stroke Mother   . Alcohol abuse Father     SOCIAL HISTORY: Social History   Socioeconomic History  . Marital status: Married    Spouse name: Not on file  . Number of children: 2  . Years of education: Not on file  . Highest education level: Not on file  Occupational History    Comment: disabled  Social Needs  . Financial resource strain: Not on file  . Food insecurity:    Worry: Not on file    Inability: Not on file  . Transportation needs:    Medical: Not on file    Non-medical: Not on file  Tobacco Use  . Smoking  status: Never Smoker  . Smokeless tobacco: Never Used  Substance and Sexual Activity  . Alcohol use: Never    Frequency: Never  . Drug use: Never  . Sexual activity: Not on file  Lifestyle  . Physical activity:    Days per week: Not on file    Minutes per session: Not on file  . Stress: Not on file  Relationships  . Social connections:    Talks on phone: Not on file    Gets together: Not on file    Attends religious service: Not on file    Active member of club or organization: Not on file    Attends meetings of clubs or organizations: Not on file    Relationship status: Not on file  . Intimate partner violence:    Fear of current or ex partner: Not on file    Emotionally abused: Not on file    Physically abused: Not on file    Forced sexual activity: Not on file  Other Topics Concern  . Not on file  Social History Narrative   Lives with family     PHYSICAL EXAM  VIDEO EXAM  GENERAL EXAM/CONSTITUTIONAL:  Vitals: There were no vitals filed for this visit.  There is no height or weight on file to calculate BMI. Wt Readings from Last 3 Encounters:  No data found for Wt     Patient is in no distress; well developed, nourished and groomed; neck is supple   NEUROLOGIC: MENTAL STATUS:  No flowsheet data found.  awake, alert, oriented to person, place and time  recent and remote memory intact  normal attention and concentration  language fluent, comprehension intact, naming intact  fund of knowledge appropriate  CRANIAL NERVE:   2nd, 3rd, 4th, 6th - visual fields full to confrontation, extraocular muscles intact, no nystagmus  5th - facial sensation symmetric  7th - facial strength symmetric  8th - hearing intact  11th - shoulder shrug symmetric  12th - tongue protrusion midline  MOTOR:   NO TREMOR; NO DRIFT IN BUE  SENSORY:   normal and symmetric to light touch  COORDINATION:   fine finger movements normal    DIAGNOSTIC DATA (LABS,  IMAGING, TESTING) - I reviewed patient records, labs, notes, testing and imaging myself where available.  No results found for: WBC, HGB, HCT, MCV, PLT No results found for: NA, K, CL, CO2, GLUCOSE, BUN, CREATININE, CALCIUM, PROT, ALBUMIN, AST, ALT, ALKPHOS, BILITOT, GFRNONAA, GFRAA No results found for: CHOL, HDL, LDLCALC, LDLDIRECT, TRIG, CHOLHDL No results found for: HGBA1C No results found for: VITAMINB12 No results found for: TSH  11/24/14 CT head [I reviewed images myself and agree with interpretation. 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 Probable arachnoid cyst just posterior to  the clivus. Gray-white compartments appear normal without evidence suggesting acute infarct. No hemorrhage or edema.   01/24/17 MRI cervical spine  - Mild degenerative disc disease at C5-C6. Otherwise normal MRI of the cervical spine.  - Incidental benign-appearing brainstem tectal plate lipoma   6/72/09 MRI lumbar spine  - Mild degenerative changes at L4-L5 and L5-S1  - There is no spinal stenosis or disc herniation   ASSESSMENT AND PLAN  38 y.o. year old female here with:  Dx:  1. Migraine with aura and without status migrainosus, not intractable   2. Lipoma, unspecified site     Virtual Visit via Video Note  I connected with Fonnie Crookshanks on 06/03/18 at  3:30 PM EDT by a video enabled telemedicine application and verified that I am speaking with the correct person using two identifiers.  Location: Patient: home Provider: office   I discussed the limitations of evaluation and management by telemedicine and the availability of in person appointments. The patient expressed understanding and agreed to proceed.  I discussed the assessment and treatment plan with the patient. The patient was provided an opportunity to ask questions and all were answered. The patient agreed with the plan and demonstrated an understanding of the instructions.   The  patient was advised to call back or seek an in-person evaluation if the symptoms worsen or if the condition fails to improve as anticipated.  I provided 30 minutes of non-face-to-face time during this encounter.   PLAN:  MIGRAINE WITH AURA - MIGRAINE PREVENTION --> start aimovig monthly (patient has tried and failed Topamax, propranolol, amitriptyline, nortriptyline, Imitrex, Maxalt, Fioricet, Botox; cannot take Depakote due to weight gain)  - MIGRAINE RESCUE --> rizatriptan 10mg  as needed for breakthrough headache; may repeat x 1 after 2 hours; max 2 tabs per day or 8 per month  Meds ordered this encounter  Medications  . Galcanezumab-gnlm (EMGALITY) 120 MG/ML SOAJ    Sig: Inject 120 mg into the skin every 30 (thirty) days.    Dispense:  3 pen    Refill:  4  . rizatriptan (MAXALT-MLT) 10 MG disintegrating tablet    Sig: Take 1 tablet (10 mg total) by mouth as needed for migraine. May repeat in 2 hours if needed    Dispense:  9 tablet    Refill:  11   Return in about 3 months (around 09/03/2018) for with NP (Amy Lomax).    Penni Bombard, MD 04/08/960, 8:36 PM Certified in Neurology, Neurophysiology and Neuroimaging  [facility] [address] [place], [place] [address] [phone]

## 2018-11-04 ENCOUNTER — Encounter: Payer: Self-pay | Admitting: Radiology

## 2019-01-07 ENCOUNTER — Other Ambulatory Visit (HOSPITAL_COMMUNITY): Payer: Self-pay | Admitting: Physician Assistant

## 2019-01-07 DIAGNOSIS — M25562 Pain in left knee: Secondary | ICD-10-CM

## 2019-08-14 DIAGNOSIS — D170 Benign lipomatous neoplasm of skin and subcutaneous tissue of head, face and neck: Secondary | ICD-10-CM | POA: Insufficient documentation

## 2019-08-14 DIAGNOSIS — R030 Elevated blood-pressure reading, without diagnosis of hypertension: Secondary | ICD-10-CM | POA: Insufficient documentation

## 2019-08-14 DIAGNOSIS — M4722 Other spondylosis with radiculopathy, cervical region: Secondary | ICD-10-CM | POA: Insufficient documentation

## 2019-08-27 ENCOUNTER — Other Ambulatory Visit: Payer: Self-pay | Admitting: Neurosurgery

## 2019-09-03 ENCOUNTER — Other Ambulatory Visit: Payer: Self-pay | Admitting: Neurosurgery

## 2019-09-03 DIAGNOSIS — M4722 Other spondylosis with radiculopathy, cervical region: Secondary | ICD-10-CM

## 2019-09-03 DIAGNOSIS — D170 Benign lipomatous neoplasm of skin and subcutaneous tissue of head, face and neck: Secondary | ICD-10-CM

## 2019-09-03 DIAGNOSIS — M5127 Other intervertebral disc displacement, lumbosacral region: Secondary | ICD-10-CM

## 2019-10-31 ENCOUNTER — Ambulatory Visit
Admission: RE | Admit: 2019-10-31 | Discharge: 2019-10-31 | Disposition: A | Discharge status: Home / Self Care | Payer: BC Managed Care – PPO | Source: Ambulatory Visit | Attending: Neurosurgery | Admitting: Neurosurgery

## 2019-10-31 ENCOUNTER — Other Ambulatory Visit: Payer: Self-pay

## 2019-10-31 DIAGNOSIS — D170 Benign lipomatous neoplasm of skin and subcutaneous tissue of head, face and neck: Secondary | ICD-10-CM

## 2019-10-31 DIAGNOSIS — M5127 Other intervertebral disc displacement, lumbosacral region: Secondary | ICD-10-CM

## 2019-10-31 DIAGNOSIS — M4722 Other spondylosis with radiculopathy, cervical region: Secondary | ICD-10-CM

## 2019-10-31 IMAGING — MR MR HEAD WO/W CM
12 series · 48 of 48 positions shown · IV contrast (multihance)
Comparison: Head CT [DATE]

CLINICAL DATA: Low back pain radiating to the buttocks and legs.
Electric shock feeling. Headache and dizziness. Tinnitus.

EXAM:
MRI HEAD WITHOUT AND WITH CONTRAST
TECHNIQUE: Multiplanar, multiecho pulse sequences of the brain and surrounding
structures were obtained without and with intravenous contrast.
CONTRAST:  20mL MULTIHANCE GADOBENATE DIMEGLUMINE 529 MG/ML IV SOLN

[Series 2: t1_se_sag · sagittal · 5.0mm · 0.47mm/px · 1 of 26 slices shown]
[im 1/26]
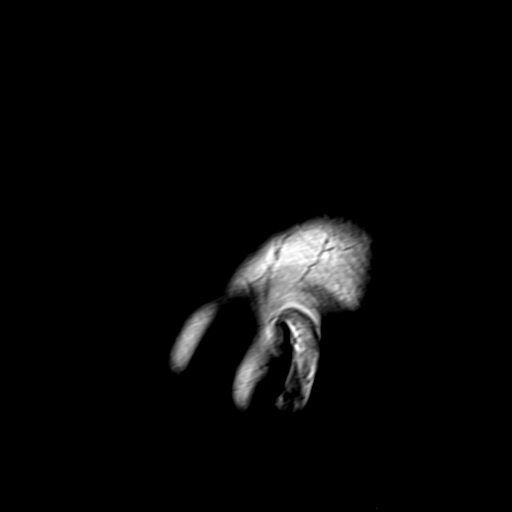

[Series 3: ep2d_diff_3 · axial · 3.0mm · 1.80mm/px · z∈[-72,+79]mm · 5 of 102 slices shown]
[im 1/102]
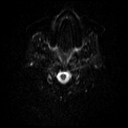
[im 26/102]
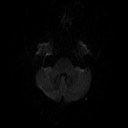
[im 51/102]
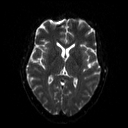
[im 76/102]
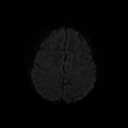
[im 102/102]
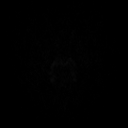

[Series 4: ep2d_diff_3_adc · axial · 3.0mm · 1.80mm/px · z∈[-72,+79]mm · 3 of 52 slices shown]
[im 1/52]
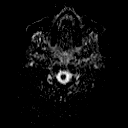
[im 26/52]
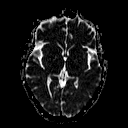
[im 52/52]
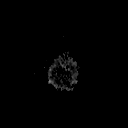

[Series 5: ep2d_diff_cor · coronal · 5.0mm · 1.77mm/px · 4 of 60 slices shown]
[im 1/60]
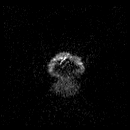
[im 20/60]
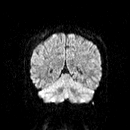
[im 40/60]
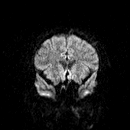
[im 60/60]
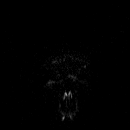

[Series 6: ep2d_diff_cor_adc · coronal · 5.0mm · 1.77mm/px · 2 of 30 slices shown]
[im 1/30]
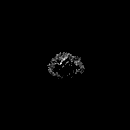
[im 30/30]
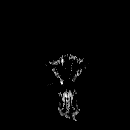

[Series 8: swi_images · axial · 2.0mm · 0.90mm/px · z∈[-78,+94]mm · 5 of 88 slices shown]
[im 1/88]
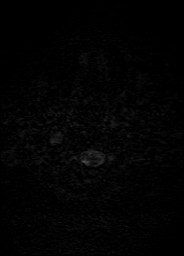
[im 22/88]
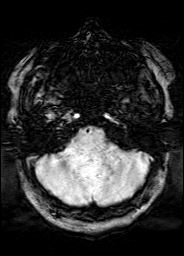
[im 44/88]
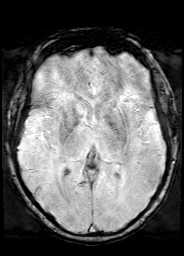
[im 66/88]
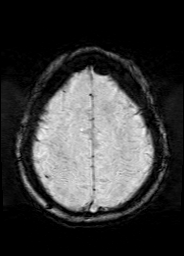
[im 88/88]
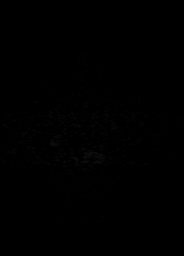

[Series 9: FLAIR · axial · 3.0mm · 0.43mm/px · z∈[-71,+84]mm · 2 of 27 slices shown]
[im 1/27]
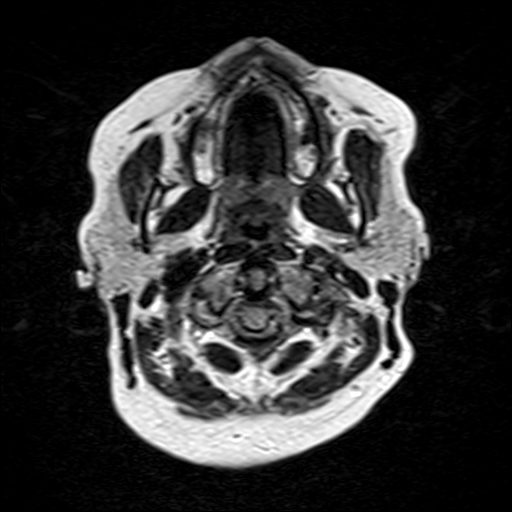
[im 27/27]
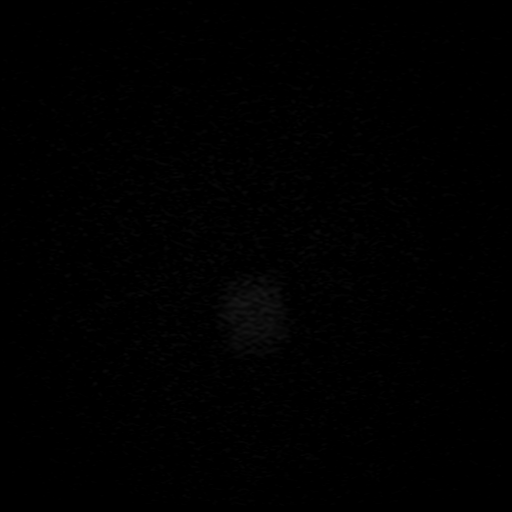

[Series 10: t2_tse_tra_512 · axial · 5.0mm · 0.60mm/px · z∈[-70,+85]mm · 2 of 27 slices shown]
[im 1/27]
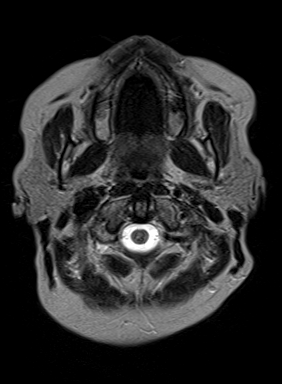
[im 27/27]
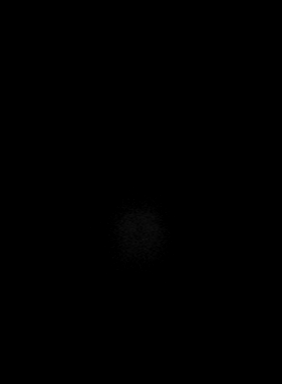

[Series 11: t1_mpr_tra · axial · 1.0mm · 0.72mm/px · z∈[-71,+86]mm · 10 of 160 slices shown]
[im 1/160]
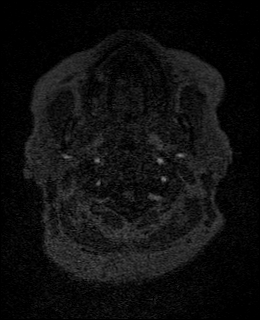
[im 18/160]
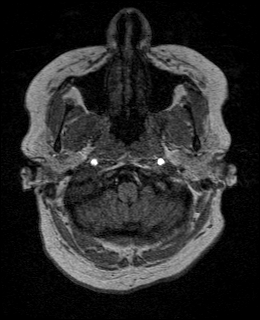
[im 36/160]
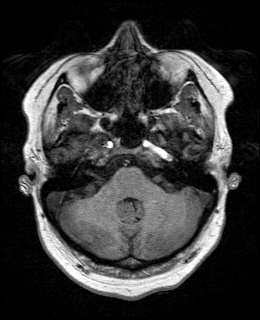
[im 54/160]
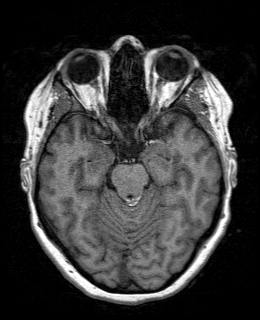
[im 71/160]
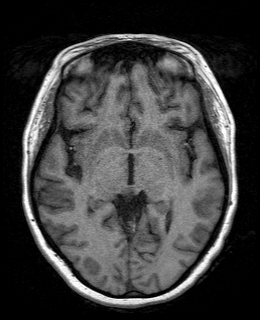
[im 89/160]
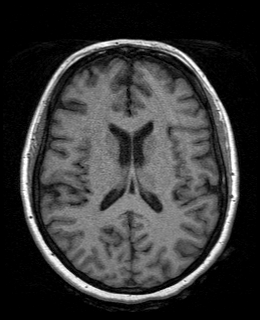
[im 107/160]
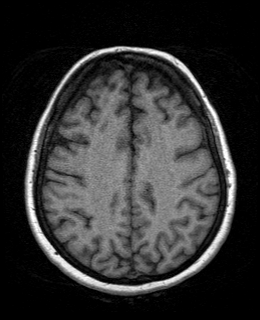
[im 124/160]
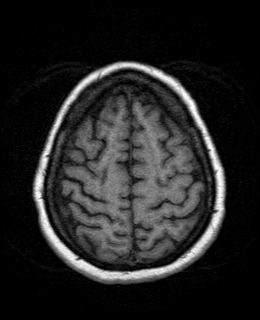
[im 142/160]
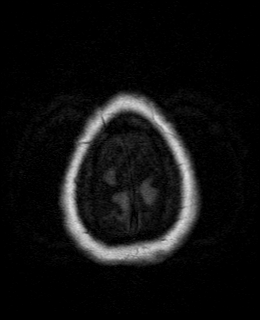
[im 160/160]
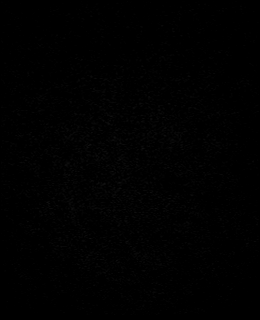

[Series 12: T2 · coronal · 5.0mm · 0.45mm/px · 2 of 31 slices shown]
[im 1/31]
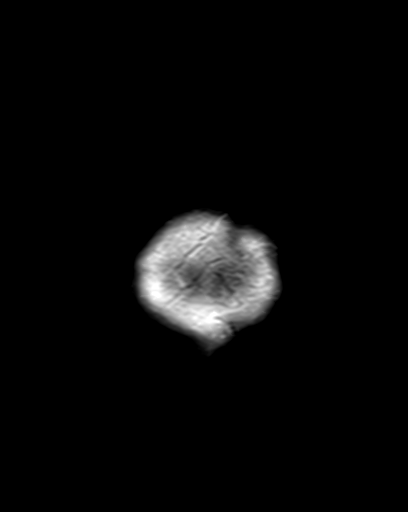
[im 31/31]
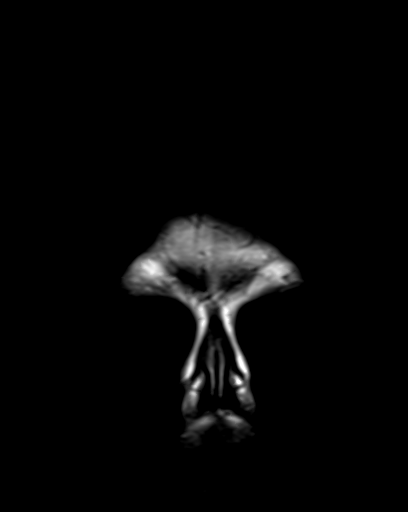

[Series 13: T1 post-contrast · coronal · 5.0mm · 0.72mm/px · 2 of 31 slices shown]
[im 1/31]
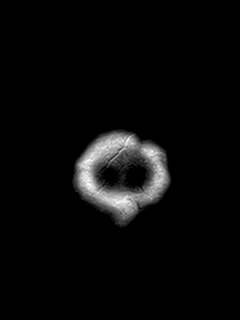
[im 31/31]
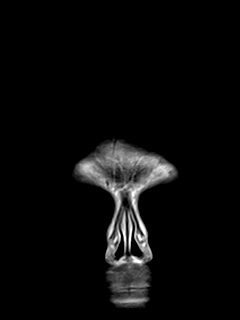

[Series 14: post t1_mpr_tra · axial · 1.0mm · 0.72mm/px · z∈[-71,+86]mm · 10 of 160 slices shown]
[im 1/160]
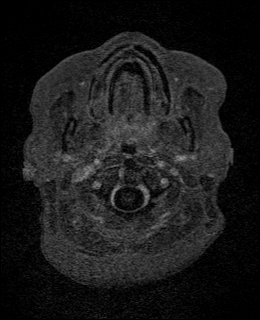
[im 18/160]
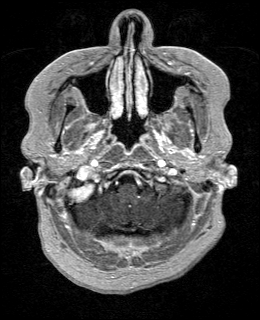
[im 36/160]
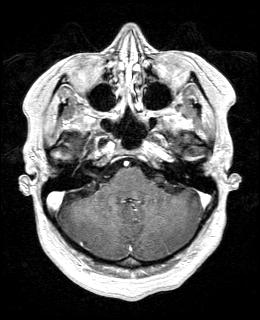
[im 54/160]
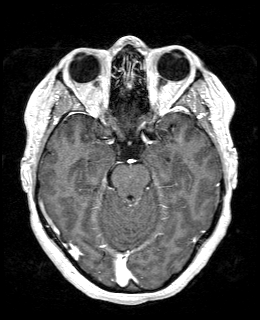
[im 71/160]
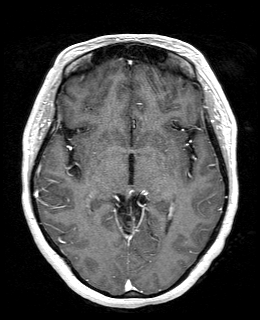
[im 89/160]
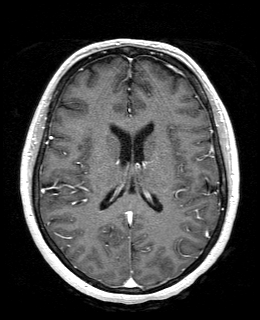
[im 107/160]
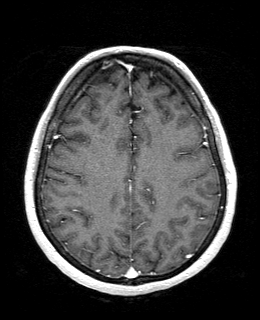
[im 124/160]
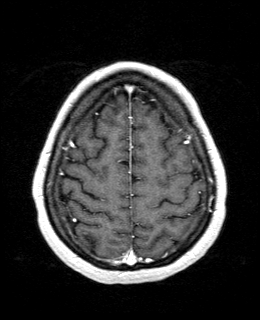
[im 142/160]
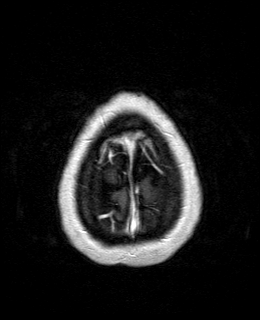
[im 160/160]
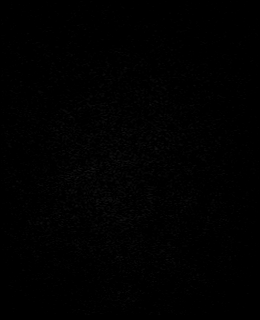

[48 of 48 positions shown; findings below may reference images not displayed]

FINDINGS: Brain: The brain has a normal appearance without evidence of
malformation, atrophy, old or acute small or large vessel
infarction, mass lesion, hemorrhage, hydrocephalus or extra-axial
collection. Incidental developmental lipoma within the posterior
perimesencephalic cistern just dorsal to the tectum of the midbrain.
No abnormal contrast enhancement occurs. No sign of demyelinating
disease.

Vascular: Major vessels at the base of the brain show flow. Venous
sinuses appear patent.

Skull and upper cervical spine: Normal.

Sinuses/Orbits: Clear/normal.

Other: None significant.
IMPRESSION: Normal appearance of the brain itself aside from head an incidental
developmental lipoma within the posterior perimesencephalic cistern
just dorsal to the tectum of the midbrain. No sign of demyelinating
disease.

## 2019-10-31 IMAGING — MR MR CERVICAL SPINE W/O CM
5 series · 30 of 48 positions shown · non-contrast
Comparison: [DATE]

CLINICAL DATA: Shock sensation in the back.

EXAM:
MRI CERVICAL SPINE WITHOUT CONTRAST
TECHNIQUE: Multiplanar, multisequence MR imaging of the cervical spine was
performed. No intravenous contrast was administered.

[Series 4: tir sag · sagittal · 3.0mm · 0.41mm/px · 6 of 17 slices shown]
[im 1/17]
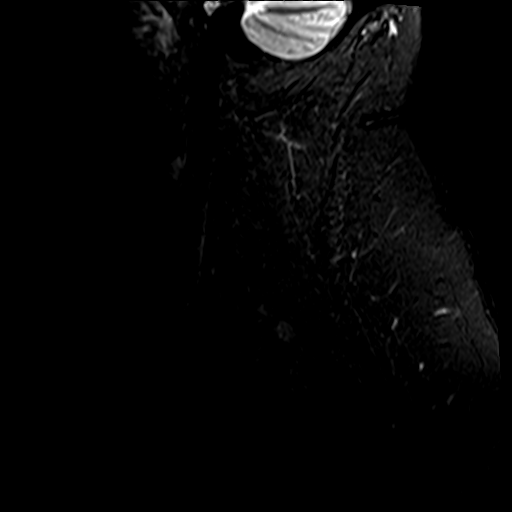
[im 4/17]
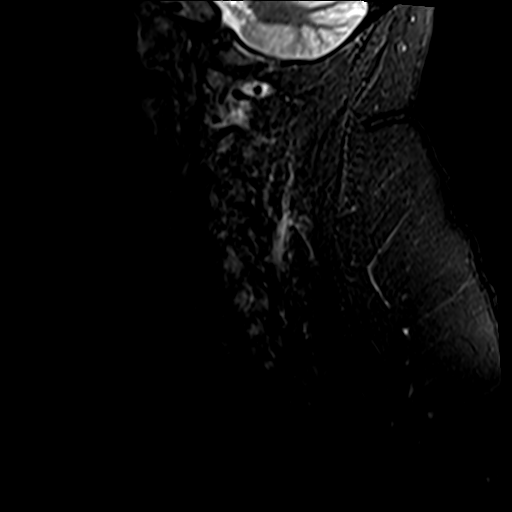
[im 7/17]
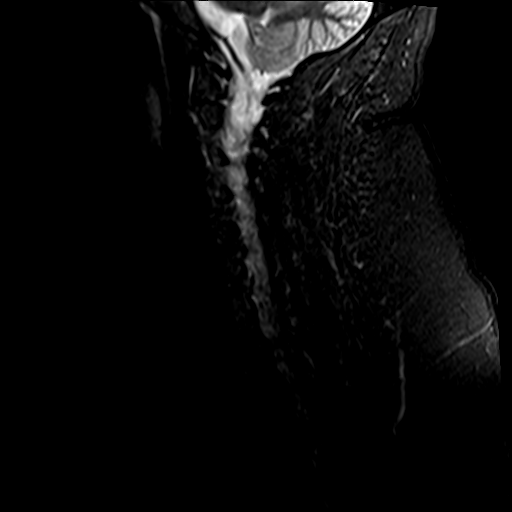
[im 10/17]
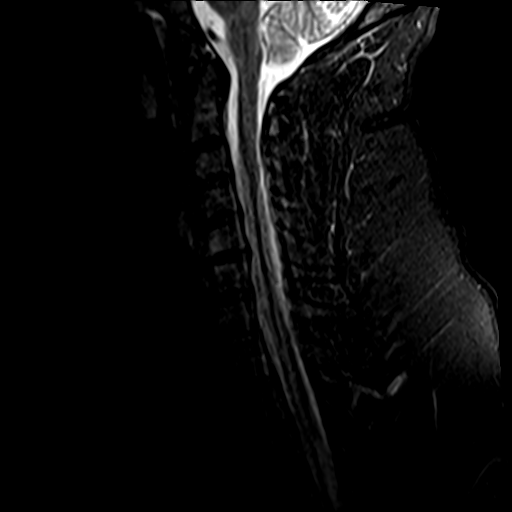
[im 13/17]
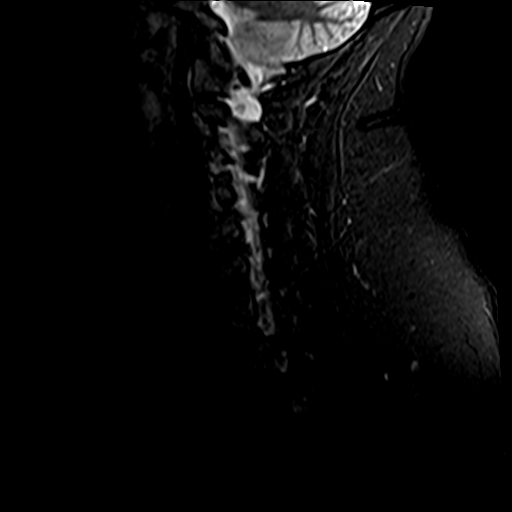
[im 17/17]
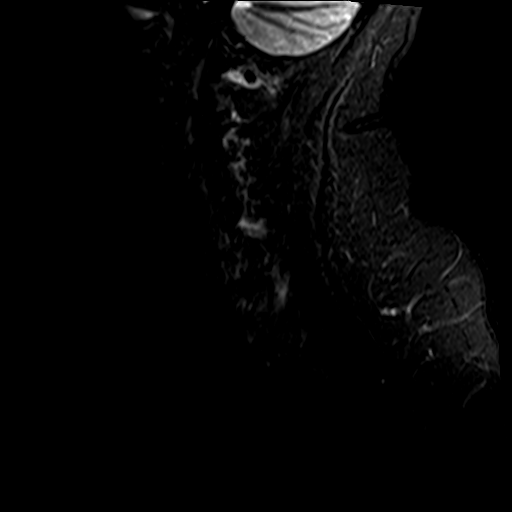

[Series 5: T1 · sagittal · 3.0mm · 0.41mm/px · 7 of 17 slices shown]
[im 1/17]
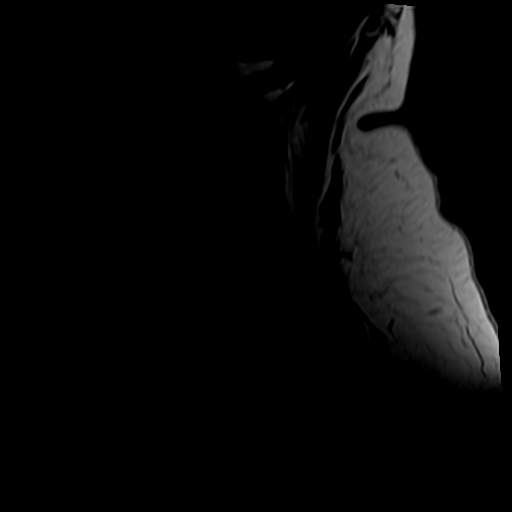
[im 3/17]
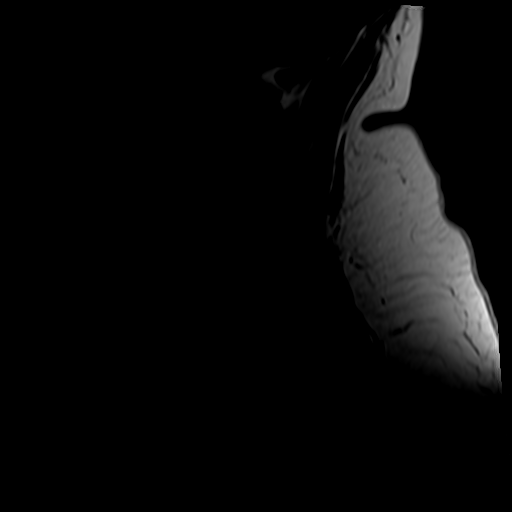
[im 6/17]
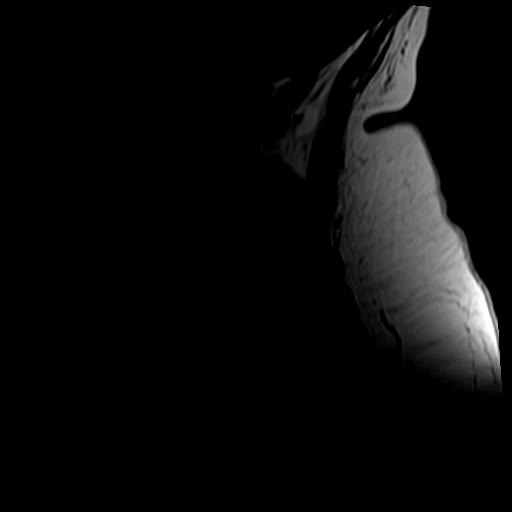
[im 9/17]
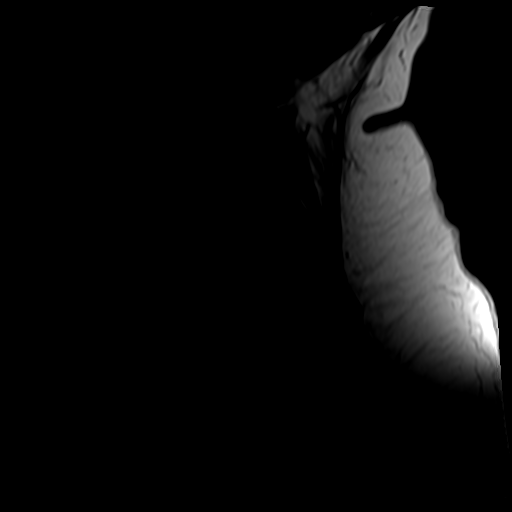
[im 11/17]
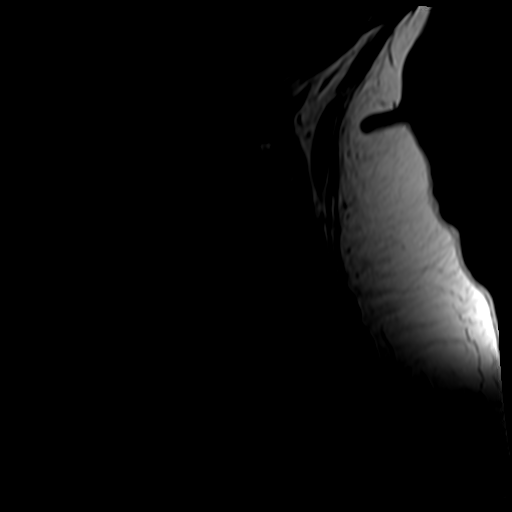
[im 14/17]
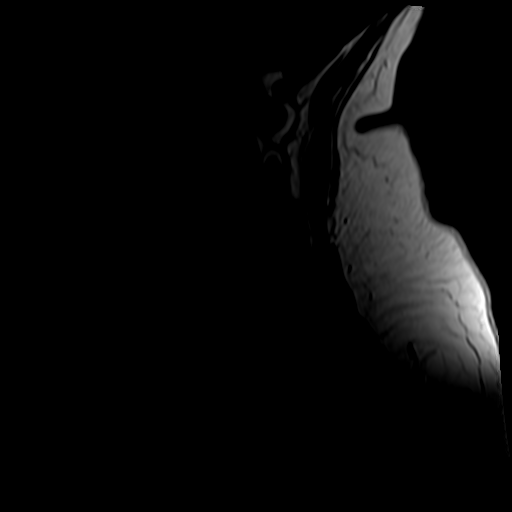
[im 17/17]
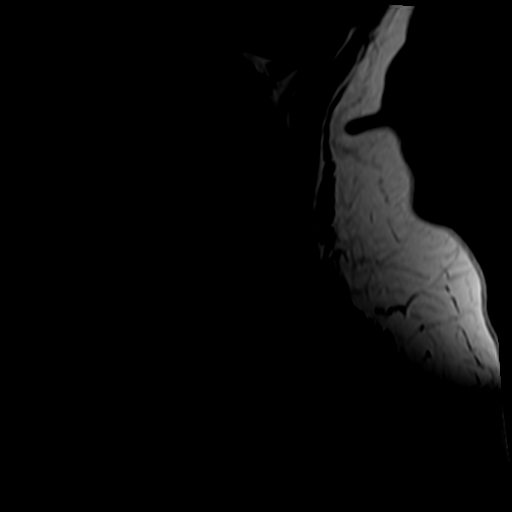

[Series 6: T2 · sagittal · 3.0mm · 0.82mm/px · 7 of 17 slices shown (1 of 2)]
[im 1/17]
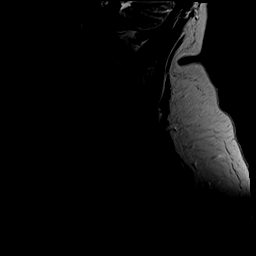
[im 3/17]
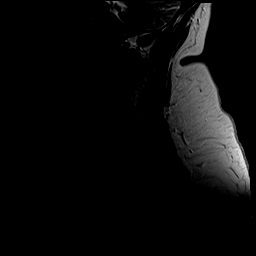
[im 6/17]
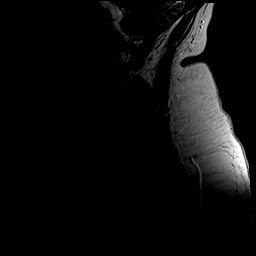
[im 9/17]
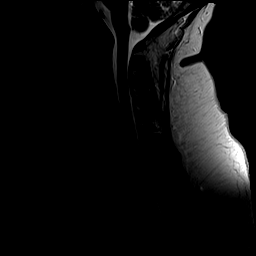
[im 11/17]
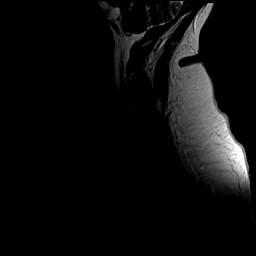
[im 14/17]
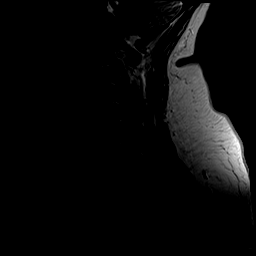
[im 17/17]
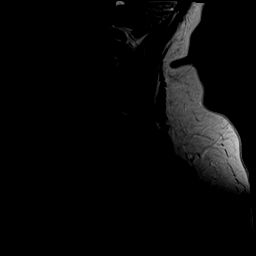

[Series 7: GRE · axial · 3.0mm · 0.35mm/px · z∈[-94,-75]mm · 2 of 36 slices shown]
[im 1/36]
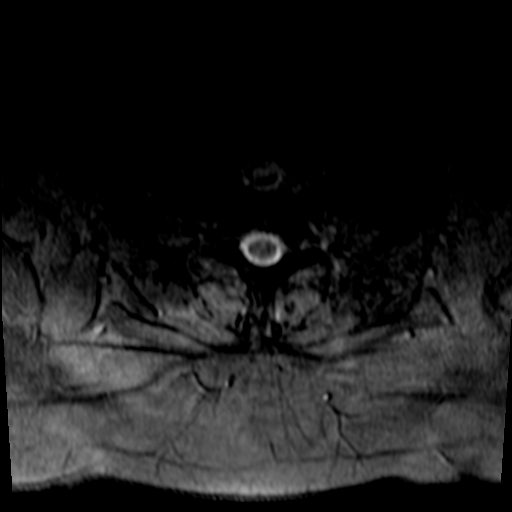
[im 6/36]
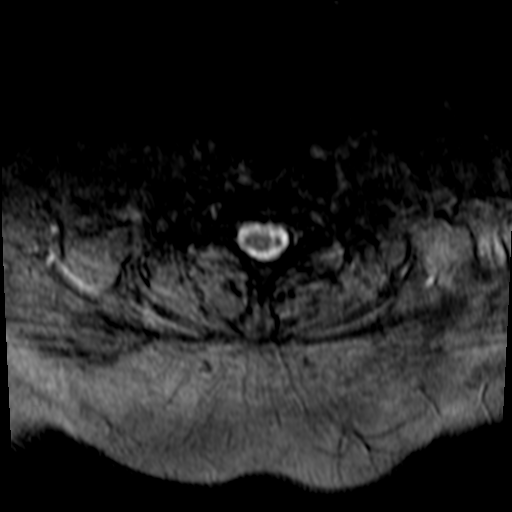

[Series 8: T2 · axial · 3.0mm · 0.70mm/px · z∈[-94,+38]mm · 8 of 36 slices shown (2 of 2)]
[im 1/36]
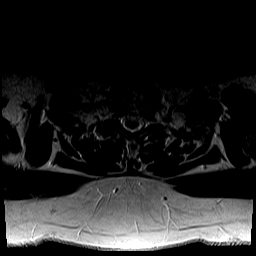
[im 6/36]
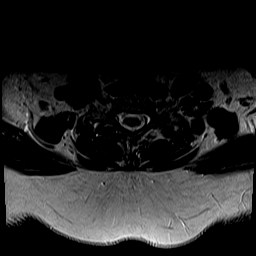
[im 11/36]
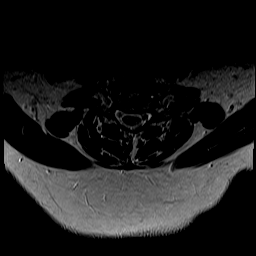
[im 17/36]
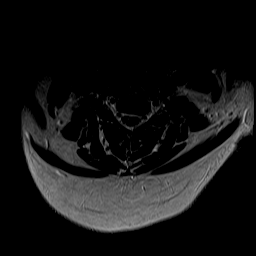
[im 19/36]
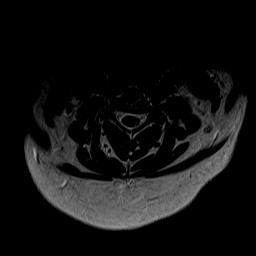
[im 25/36]
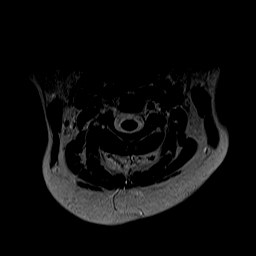
[im 30/36]
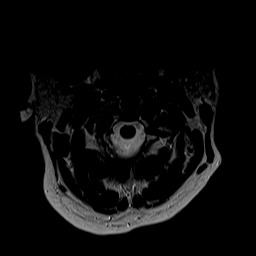
[im 36/36]
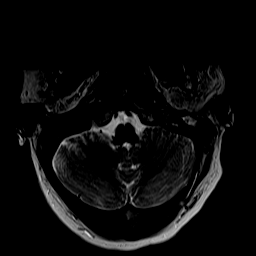

[30 of 48 positions shown; findings below may reference images not displayed]

FINDINGS: Alignment: Straightening of the normal cervical lordosis.

Vertebrae: Mild discogenic endplate edema at C5-6 which could be
associated with cervical region neck pain.

Cord: No cord compression or primary cord lesion.

Posterior Fossa, vertebral arteries, paraspinal tissues: Normal

Disc levels:

No abnormality at the foramen magnum, C1-2, C2-3 or C3-4.

C4-5: Minimal noncompressive disc bulge.

C5-6: Mild noncompressive disc bulge.

C6-7: Minimal noncompressive disc bulge.

C7-T1: Normal

No significant change since [BS], other than the visualization of
the discogenic marrow changes at C5-6.
IMPRESSION: Mild non-compressive disc bulges at C4-5, C5-6 and C6-7. Mild
discogenic endplate edema at C5-6 which could be associated with
cervical region neck pain. No change since [BS], other than the
demonstration of the discogenic C5-6 marrow changes.

## 2019-10-31 IMAGING — MR MR LUMBAR SPINE W/O CM
4 of 5 series · 25 of 48 positions shown · non-contrast
Comparison: [DATE]

CLINICAL DATA: Low back pain radiating to the buttocks and legs.

EXAM:
MRI LUMBAR SPINE WITHOUT CONTRAST
TECHNIQUE: Multiplanar, multisequence MR imaging of the lumbar spine was
performed. No intravenous contrast was administered.

[Series 3: T2 · sagittal · 4.0mm · 0.55mm/px · 7 of 18 slices shown (1 of 2)]
[im 1/18]
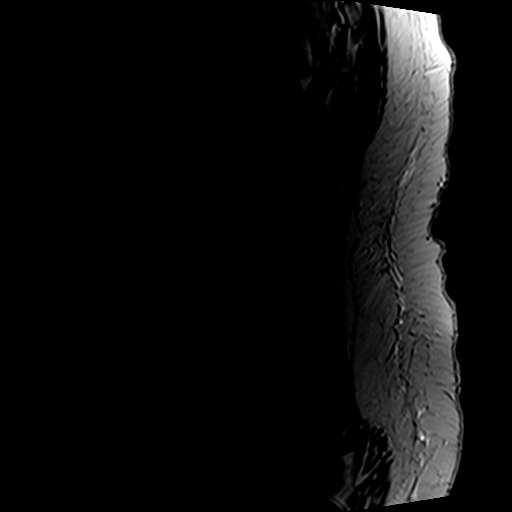
[im 3/18]
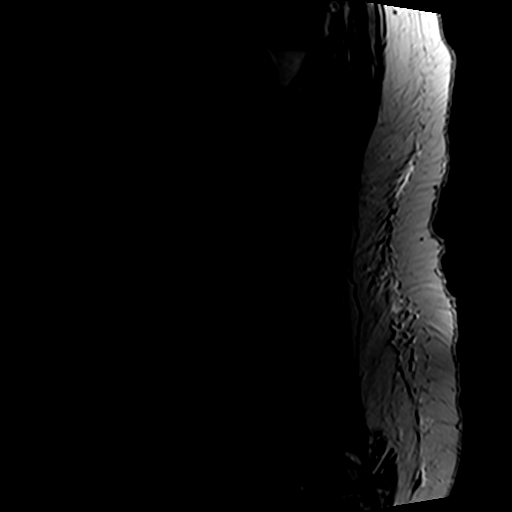
[im 6/18]
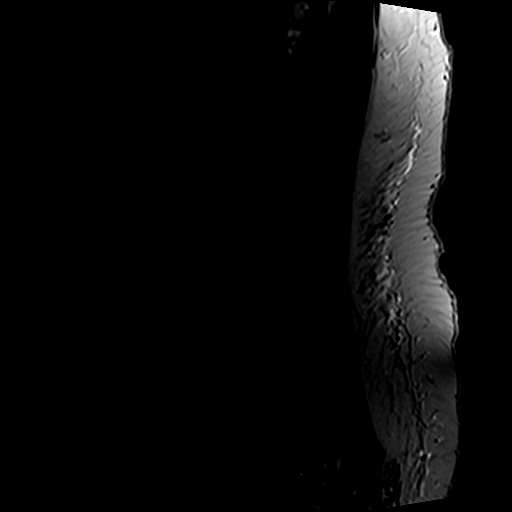
[im 9/18]
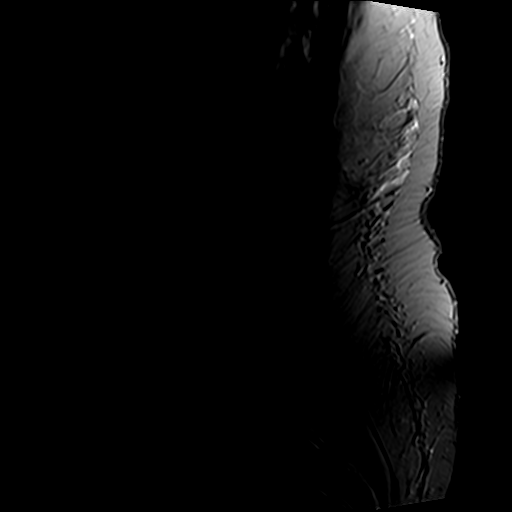
[im 12/18]
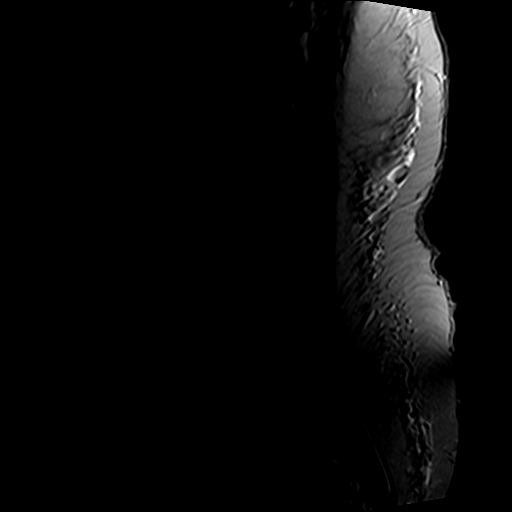
[im 15/18]
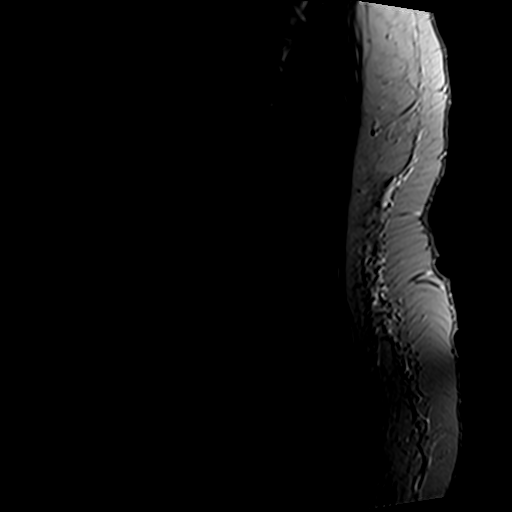
[im 18/18]
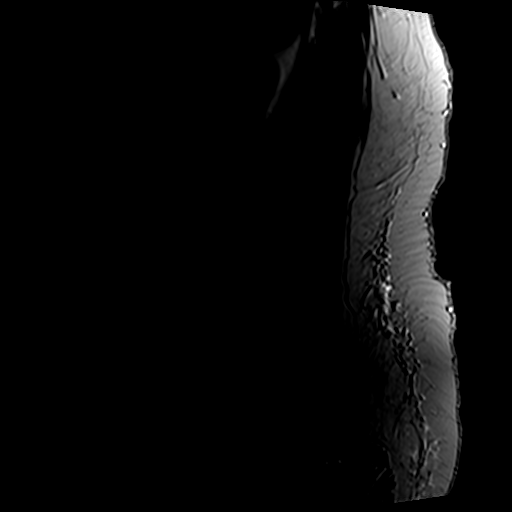

[Series 5: T1 · sagittal · 4.0mm · 0.55mm/px · 6 of 18 slices shown (1 of 2)]
[im 1/18]
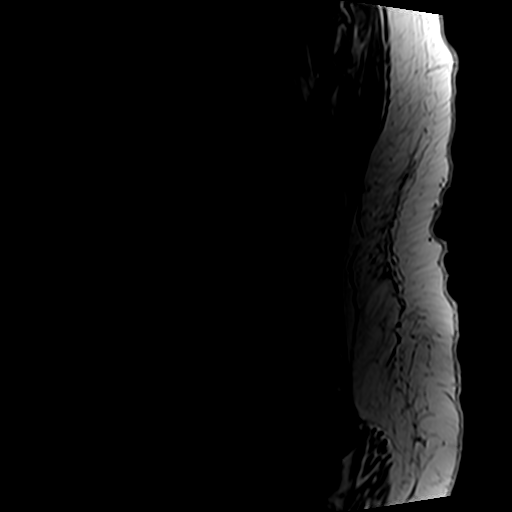
[im 4/18]
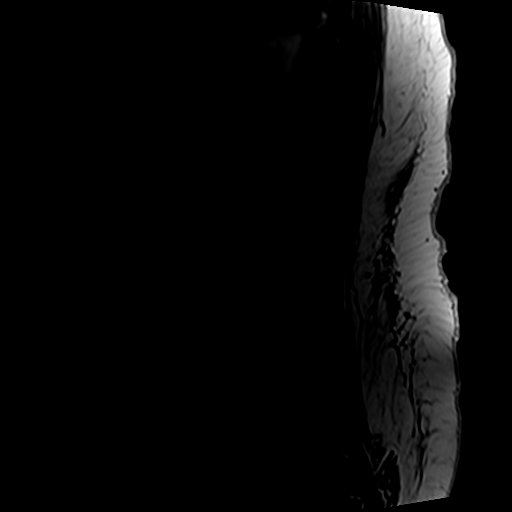
[im 7/18]
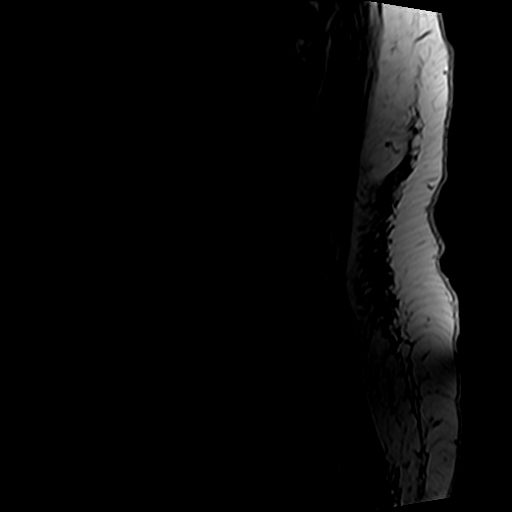
[im 11/18]
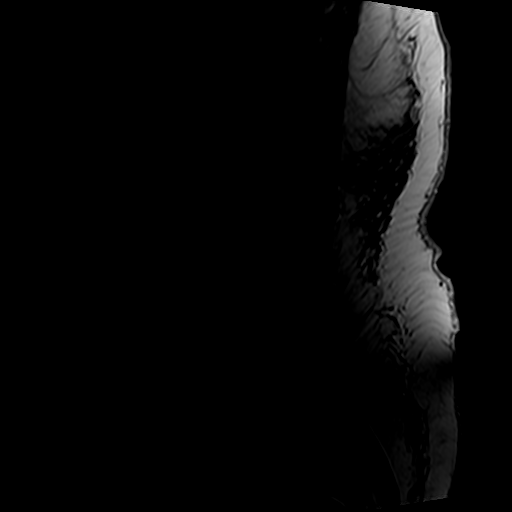
[im 14/18]
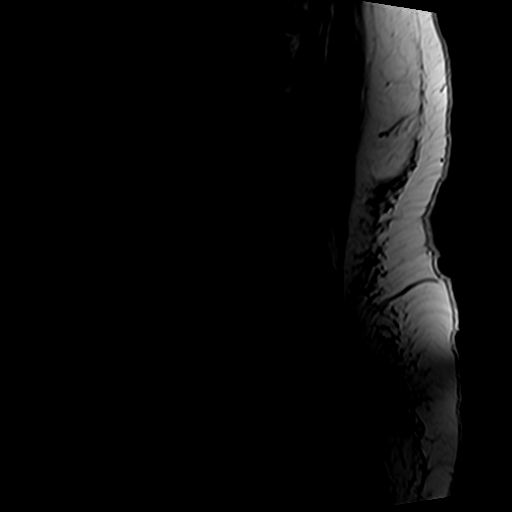
[im 18/18]
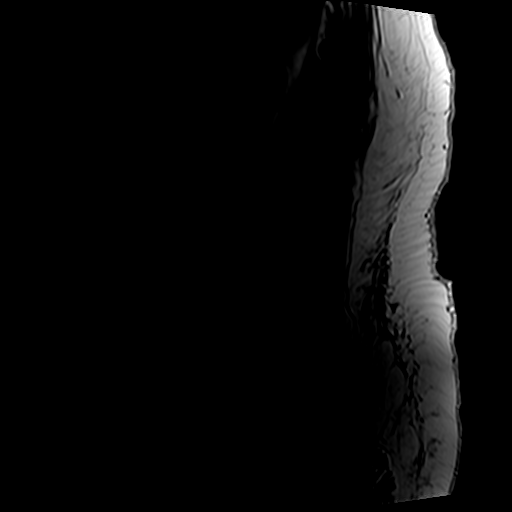

[Series 6: T2 · axial · 4.0mm · 0.70mm/px · z∈[-190,+24]mm · 8 of 41 slices shown (2 of 2)]
[im 1/41]
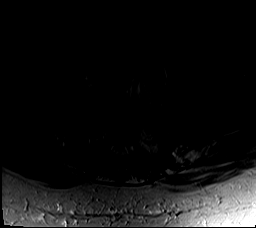
[im 7/41]
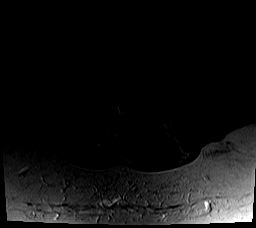
[im 13/41]
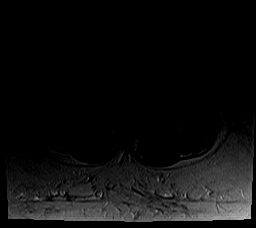
[im 19/41]
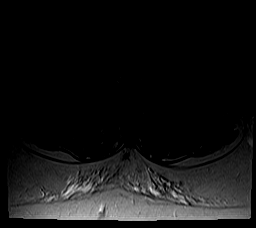
[im 22/41]
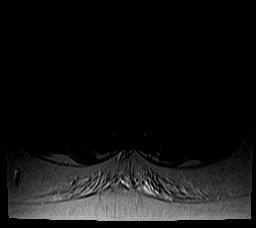
[im 28/41]
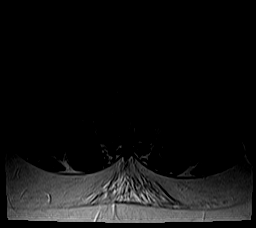
[im 34/41]
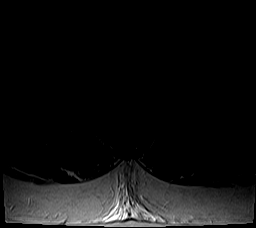
[im 41/41]
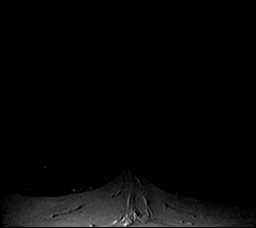

[Series 7: T1 · axial · 4.0mm · 0.35mm/px · z∈[-190,-13]mm · 4 of 41 slices shown (2 of 2)]
[im 1/41]
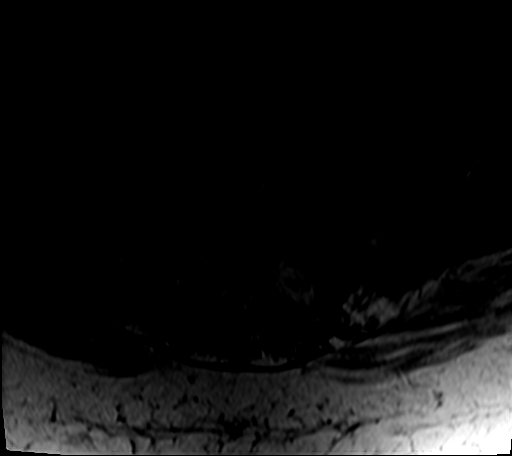
[im 7/41]
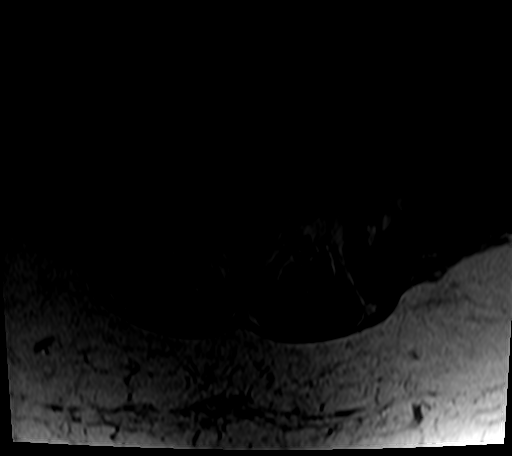
[im 22/41]
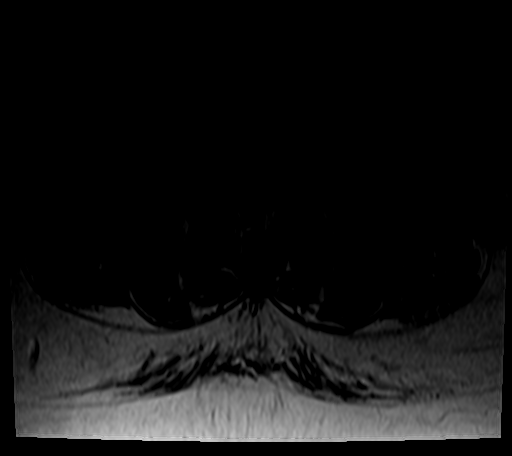
[im 34/41]
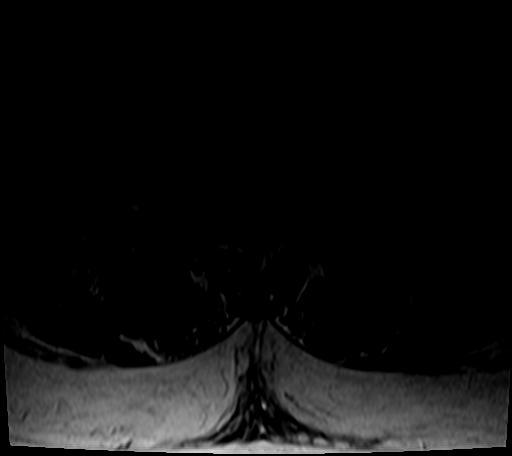

[25 of 48 positions shown; findings below may reference images not displayed]

FINDINGS: Segmentation:  5 lumbar type vertebral bodies.

Alignment:  Normal

Vertebrae: Benign appearing fatty change within the anterior L1
vertebral body, not significant. Discogenic edematous change within
the L5 and S1 marrow, likely associated with low back pain.

Conus medullaris and cauda equina: Conus extends to the L1 level.
Conus and cauda equina appear normal.

Paraspinal and other soft tissues: Negative

Disc levels:

No abnormality at L3-4 or above.

L4-5: Desiccation of disc with bulging and a small central disc
protrusion. Slight indentation of the thecal sac. Mild facet and
ligamentous prominence. Mild narrowing of the lateral recesses but
without visible neural compression. Slight worsening since [BI].

L5-S1: Disc degeneration with shallow protrusion of the disc,
involuted somewhat since the study of [BI]. Contact with the ventral
thecal sac and S1 root sleeves but no evidence neural compression.
Foraminal narrowing right more than left. Discogenic marrow edema as
noted above, likely associated with back pain.
IMPRESSION: 1. L4-5: Disc degeneration with bulging and a small central disc
protrusion. Mild facet and ligamentous prominence. Mild narrowing of
the lateral recesses but without visible neural compression. Slight
worsening since [DATE]. L5-S1: Disc degeneration with shallow protrusion, involuted
somewhat since [BI]. Contact with the ventral thecal sac and S1 root
sleeves but no evidence of neural compression. Foraminal narrowing
right more than left. Discogenic marrow edema of the L5 and S1
marrow, likely associated with back pain.

## 2019-10-31 MED ORDER — GADOBENATE DIMEGLUMINE 529 MG/ML IV SOLN
20.0000 mL | Freq: Once | INTRAVENOUS | Status: AC | PRN
  Administered 2019-10-31: 20 mL via INTRAVENOUS

## 2019-11-06 DIAGNOSIS — M0579 Rheumatoid arthritis with rheumatoid factor of multiple sites without organ or systems involvement: Secondary | ICD-10-CM | POA: Insufficient documentation

## 2019-11-06 DIAGNOSIS — M5136 Other intervertebral disc degeneration, lumbar region: Secondary | ICD-10-CM | POA: Insufficient documentation

## 2020-04-12 DIAGNOSIS — R29898 Other symptoms and signs involving the musculoskeletal system: Secondary | ICD-10-CM | POA: Insufficient documentation

## 2020-04-12 DIAGNOSIS — R202 Paresthesia of skin: Secondary | ICD-10-CM | POA: Insufficient documentation

## 2020-04-12 DIAGNOSIS — R200 Anesthesia of skin: Secondary | ICD-10-CM | POA: Insufficient documentation

## 2020-06-08 ENCOUNTER — Other Ambulatory Visit (HOSPITAL_COMMUNITY): Payer: Self-pay | Admitting: Internal Medicine

## 2020-06-08 DIAGNOSIS — S8991XA Unspecified injury of right lower leg, initial encounter: Secondary | ICD-10-CM

## 2020-06-22 DIAGNOSIS — Z6841 Body Mass Index (BMI) 40.0 and over, adult: Secondary | ICD-10-CM | POA: Insufficient documentation

## 2020-07-16 ENCOUNTER — Other Ambulatory Visit: Payer: Self-pay

## 2020-07-16 DIAGNOSIS — I8393 Asymptomatic varicose veins of bilateral lower extremities: Secondary | ICD-10-CM

## 2020-07-23 ENCOUNTER — Other Ambulatory Visit: Payer: Self-pay

## 2020-07-23 ENCOUNTER — Ambulatory Visit: Payer: BC Managed Care – PPO | Admitting: Physician Assistant

## 2020-07-23 ENCOUNTER — Ambulatory Visit (HOSPITAL_COMMUNITY)
Admission: RE | Admit: 2020-07-23 | Discharge: 2020-07-23 | Disposition: A | Discharge status: Home / Self Care | Payer: BC Managed Care – PPO | Source: Ambulatory Visit | Attending: Vascular Surgery | Admitting: Vascular Surgery

## 2020-07-23 VITALS — BP 130/88 | HR 91 | Temp 97.9°F | Resp 20 | Ht 62.0 in | Wt 244.3 lb

## 2020-07-23 DIAGNOSIS — I8393 Asymptomatic varicose veins of bilateral lower extremities: Secondary | ICD-10-CM

## 2020-07-23 DIAGNOSIS — I872 Venous insufficiency (chronic) (peripheral): Secondary | ICD-10-CM | POA: Diagnosis not present

## 2020-07-23 DIAGNOSIS — R200 Anesthesia of skin: Secondary | ICD-10-CM | POA: Insufficient documentation

## 2020-07-23 DIAGNOSIS — M130 Polyarthritis, unspecified: Secondary | ICD-10-CM | POA: Insufficient documentation

## 2020-07-23 DIAGNOSIS — G8220 Paraplegia, unspecified: Secondary | ICD-10-CM | POA: Insufficient documentation

## 2020-07-23 DIAGNOSIS — M1990 Unspecified osteoarthritis, unspecified site: Secondary | ICD-10-CM | POA: Insufficient documentation

## 2020-07-23 DIAGNOSIS — E559 Vitamin D deficiency, unspecified: Secondary | ICD-10-CM | POA: Insufficient documentation

## 2020-07-23 NOTE — Progress Notes (Signed)
Requested by:  [employer], [address],  [place] [address]  Reason for consultation: Painful varicose veins of bilateral lower extremities   History of Present Illness   Andrea Zamora is a 40 y.o. (May 24, 1980) female who presents for evaluation of painful varicose veins of bilateral lower extremities right more than left.  Veins are becoming more prominent since the 100 pound weight loss over the last 2 and half years.    Venous symptoms include: aching, heavy, tired, throbbing, swelling Onset/duration: More prominent varicose veins of bilateral lower extremities over the past 2 and half years; 1 episode of superficial thrombophlebitis in right anterior thigh varicose vein Aggravating factors: sitting, standing Alleviating factors: Periodic elevation however she does have degenerative disc disease thus cannot stay in this position for long periods of time Compression: Knee-high compression however not worn daily Pain medications: NSAIDs which offer relief Previous vein procedures: None History of DVT: None  Past Medical History:  Diagnosis Date   Asthma    Cervical radiculitis    Chronic pain    Hypertension    Obesity     No past surgical history on file.  Social History   Socioeconomic History   Marital status: Married    Spouse name: Not on file   Number of children: 2   Years of education: Not on file   Highest education level: Not on file  Occupational History    Comment: disabled  Tobacco Use   Smoking status: Never   Smokeless tobacco: Never  Substance and Sexual Activity   Alcohol use: Never   Drug use: Never   Sexual activity: Not on file  Other Topics Concern   Not on file  Social History Narrative   Lives with family   Social Determinants of Health   Financial Resource Strain: Not on file  Food Insecurity: Not on file  Transportation Needs: Not on file  Physical Activity: Not on file  Stress: Not on file  Social  Connections: Not on file  Intimate Partner Violence: Not on file    Family History  Problem Relation Age of Onset   Stroke Mother    Alcohol abuse Father     Current Outpatient Medications  Medication Sig Dispense Refill   albuterol (PROVENTIL) 2 MG tablet Take 2 mg by mouth 3 (three) times daily.  11   albuterol (VENTOLIN HFA) 108 (90 Base) MCG/ACT inhaler Inhale into the lungs every 6 (six) hours as needed. (Patient not taking: Reported on 07/23/2020)     ALPRAZolam (XANAX) 0.5 MG tablet Take 1 tablet by mouth 3 (three) times daily as needed. (Patient not taking: Reported on 07/23/2020)  2   busPIRone (BUSPAR) 10 MG tablet Take 10 mg by mouth 2 (two) times a day.     Butalbital-APAP-Caffeine 50-325-40 MG capsule Take 1 capsule by mouth every 6 (six) hours. As needed     DOXYCYCLINE MONOHYDRATE PO Take 50 mg by mouth daily.     ferrous sulfate 325 (65 FE) MG tablet Take 325 mg by mouth daily with breakfast.     gabapentin (NEURONTIN) 600 MG tablet Take 600 mg by mouth 3 (three) times daily.     Galcanezumab-gnlm (EMGALITY) 120 MG/ML SOAJ Inject 120 mg into the skin every 30 (thirty) days. 3 pen 4   montelukast (SINGULAIR) 10 MG tablet 10 mg daily.     norethindrone-ethinyl estradiol 1/35 (ORTHO-NOVUM) tablet Take 1 tablet by mouth daily.     oxyCODONE (OXYCONTIN) 15 mg  12 hr tablet Take 15 mg by mouth every 12 (twelve) hours.     oxyCODONE-acetaminophen (PERCOCET) 10-325 MG per tablet Take 1-2 tablets by mouth every 6 (six) hours as needed.  0   pantoprazole (PROTONIX) 40 MG tablet Take 40 mg by mouth daily.  11   phentermine 37.5 MG capsule Take 37.5 mg by mouth every morning.     rizatriptan (MAXALT-MLT) 10 MG disintegrating tablet Take 1 tablet (10 mg total) by mouth as needed for migraine. May repeat in 2 hours if needed 9 tablet 11   topiramate (TOPAMAX) 25 MG tablet Take 25 mg by mouth 2 (two) times daily.  11   valACYclovir (VALTREX) 500 MG tablet Take 500 mg by mouth daily.  11    No current facility-administered medications for this visit.    Allergies  Allergen Reactions   Pregabalin Swelling   Hydrocodone     Other reaction(s): Unknown   Hydrocodone-Acetaminophen    Nucynta  [Tapentadol Hcl]    Nucynta [Tapentadol]     Other reaction(s): Unknown   Sulfamethoxazole-Trimethoprim    Metoclopramide Itching and Rash   Nitrofurantoin Itching    Other reaction(s): Unknown   Prochlorperazine Edisylate Itching and Rash   Tramadol Other (See Comments)    Stomach pain     REVIEW OF SYSTEMS (negative unless checked):   Cardiac:  '[]'$  Chest pain or chest pressure? '[]'$  Shortness of breath upon activity? '[]'$  Shortness of breath when lying flat? '[]'$  Irregular heart rhythm?  Vascular:  '[]'$  Pain in calf, thigh, or hip brought on by walking? '[]'$  Pain in feet at night that wakes you up from your sleep? '[]'$  Blood clot in your veins? '[]'$  Leg swelling?  Pulmonary:  '[]'$  Oxygen at home? '[]'$  Productive cough? '[]'$  Wheezing?  Neurologic:  '[]'$  Sudden weakness in arms or legs? '[]'$  Sudden numbness in arms or legs? '[]'$  Sudden onset of difficult speaking or slurred speech? '[]'$  Temporary loss of vision in one eye? '[]'$  Problems with dizziness?  Gastrointestinal:  '[]'$  Blood in stool? '[]'$  Vomited blood?  Genitourinary:  '[]'$  Burning when urinating? '[]'$  Blood in urine?  Psychiatric:  '[]'$  Major depression  Hematologic:  '[]'$  Bleeding problems? '[]'$  Problems with blood clotting?  Dermatologic:  '[]'$  Rashes or ulcers?  Constitutional:  '[]'$  Fever or chills?  Ear/Nose/Throat:  '[]'$  Change in hearing? '[]'$  Nose bleeds? '[]'$  Sore throat?  Musculoskeletal:  '[]'$  Back pain? '[]'$  Joint pain? '[]'$  Muscle pain?   Physical Examination     Vitals:   07/23/20 1352  BP: 130/88  Pulse: 91  Resp: 20  Temp: 97.9 F (36.6 C)  TempSrc: Temporal  SpO2: 97%  Weight: 244 lb 4.8 oz (110.8 kg)  Height: '5\' 2"'$  (1.575 m)   Body mass index is 44.68 kg/m.  General:  WDWN in NAD; vital signs  documented above Gait: Not observed HENT: WNL, normocephalic Pulmonary: normal non-labored breathing , without Rales, rhonchi,  wheezing Cardiac: regular HR Abdomen: soft, NT, no masses Skin: without rashes Vascular Exam/Pulses:  Right Left  Radial 2+ (normal) 2+ (normal)  DP 2+ (normal) 2+ (normal)   Extremities: with varicose veins, especially proximal lateral right thigh and anterior thigh; varicose veins also noted bilateral lower legs; no venous ulcerations or pigmentation changes Musculoskeletal: no muscle wasting or atrophy  Neurologic: A&O X 3;  No focal weakness or paresthesias are detected Psychiatric:  The pt has Normal affect.  Non-invasive Vascular Imaging   BLE Venous Insufficiency Duplex :  RLE:  Negative for DVT  and SVT,  GSV reflux from saphenofemoral junction down to the proximal calf, GSV diameter greater than 4.55m throughout  SSV reflux however small in diameter  deep venous reflux in common femoral vein  LLE: Negative for DVT and SVT,   GSV reflux in saphenofemoral junction, proximal thigh, and the level of the knee,  GSV diameter between 3.7 and 7 mm Negative for SSV reflux,  deep venous reflux in the common femoral vein   Medical Decision Making   JRandyl Linwoodis a 40y.o. female who presents with: BLE chronic venous insufficiency with painful varicose veins of right more than left leg  Symptomatic varicose veins with underlying venous sufficiency of the right more than left leg Venous reflux study was negative for DVT however did demonstrate an incompetent right greater saphenous vein as well as mild deep venous reflux bilaterally Patient was measured for and purchased thigh-high 20 to 30 mmHg compression to be worn daily We also discussed proper leg elevation which should be done periodically during the day Recommendations also included continued weight loss, avoiding prolonged sitting and standing, and using NSAIDs for venous symptoms She will  try the above recommendations for a period of 3 months at which point she will return for further evaluation by Dr. DScot Dockto see if she would be a good candidate for laser ablation therapy and stab phlebectomy especially of right lower extremity Patient knows to call/return office sooner if symptoms worsen   MDagoberto Ligas PA-C [facility]: [phone] 07/23/2020, 3:15 PM  Clinic MD: CD[facility]

## 2020-10-20 ENCOUNTER — Ambulatory Visit (INDEPENDENT_AMBULATORY_CARE_PROVIDER_SITE_OTHER): Payer: BC Managed Care – PPO | Admitting: Vascular Surgery

## 2020-11-24 ENCOUNTER — Other Ambulatory Visit: Payer: Self-pay

## 2020-11-24 ENCOUNTER — Ambulatory Visit (HOSPITAL_COMMUNITY)
Admission: RE | Admit: 2020-11-24 | Discharge: 2020-11-24 | Disposition: A | Discharge status: Home / Self Care | Payer: BC Managed Care – PPO | Source: Ambulatory Visit | Attending: Internal Medicine | Admitting: Internal Medicine

## 2020-11-24 ENCOUNTER — Other Ambulatory Visit (HOSPITAL_COMMUNITY): Payer: Self-pay | Admitting: Internal Medicine

## 2020-11-24 DIAGNOSIS — S6992XA Unspecified injury of left wrist, hand and finger(s), initial encounter: Secondary | ICD-10-CM | POA: Insufficient documentation
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
## 2021-06-09 ENCOUNTER — Ambulatory Visit (HOSPITAL_COMMUNITY)
Admission: RE | Admit: 2021-06-09 | Discharge: 2021-06-09 | Disposition: A | Discharge status: Home / Self Care | Payer: BC Managed Care – PPO | Source: Ambulatory Visit | Attending: Internal Medicine | Admitting: Internal Medicine

## 2021-06-09 ENCOUNTER — Other Ambulatory Visit (HOSPITAL_COMMUNITY): Payer: Self-pay | Admitting: Internal Medicine

## 2021-06-09 DIAGNOSIS — S6992XA Unspecified injury of left wrist, hand and finger(s), initial encounter: Secondary | ICD-10-CM | POA: Insufficient documentation
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
## 2022-07-19 HISTORY — PX: SALPINGECTOMY: SHX328

## 2022-08-14 ENCOUNTER — Encounter (HOSPITAL_COMMUNITY): Payer: Self-pay

## 2022-08-14 ENCOUNTER — Emergency Department (HOSPITAL_COMMUNITY)
Admission: EM | Admit: 2022-08-14 | Discharge: 2022-08-14 | Discharge status: Against Medical Advice | Payer: BC Managed Care – PPO | Attending: Emergency Medicine | Admitting: Emergency Medicine

## 2022-08-14 ENCOUNTER — Other Ambulatory Visit: Payer: Self-pay

## 2022-08-14 DIAGNOSIS — Z5321 Procedure and treatment not carried out due to patient leaving prior to being seen by health care provider: Secondary | ICD-10-CM | POA: Insufficient documentation

## 2022-08-14 MED ORDER — LACTATED RINGERS IV BOLUS: 1000.0000 mL | Freq: Once | INTRAVENOUS | Status: DC

## 2022-08-14 MED ORDER — LACTATED RINGERS IV SOLN: INTRAVENOUS | Status: DC
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
## 2022-08-14 NOTE — ED Notes (Signed)
LWBS 

## 2022-09-19 ENCOUNTER — Other Ambulatory Visit: Payer: Self-pay | Admitting: Neurosurgery

## 2022-09-19 DIAGNOSIS — R42 Dizziness and giddiness: Secondary | ICD-10-CM

## 2022-09-19 DIAGNOSIS — M5127 Other intervertebral disc displacement, lumbosacral region: Secondary | ICD-10-CM

## 2022-09-27 ENCOUNTER — Encounter: Payer: Self-pay | Admitting: Neurosurgery

## 2022-09-29 ENCOUNTER — Other Ambulatory Visit: Payer: BC Managed Care – PPO

## 2022-10-09 ENCOUNTER — Ambulatory Visit
Admission: RE | Admit: 2022-10-09 | Discharge: 2022-10-09 | Disposition: A | Discharge status: Home / Self Care | Payer: BC Managed Care – PPO | Source: Ambulatory Visit | Attending: Neurosurgery | Admitting: Neurosurgery

## 2022-10-09 DIAGNOSIS — M5127 Other intervertebral disc displacement, lumbosacral region: Secondary | ICD-10-CM

## 2022-10-09 DIAGNOSIS — R42 Dizziness and giddiness: Secondary | ICD-10-CM

## 2023-11-09 ENCOUNTER — Ambulatory Visit

## 2023-11-09 ENCOUNTER — Other Ambulatory Visit: Payer: Self-pay

## 2023-11-09 ENCOUNTER — Ambulatory Visit
Admission: RE | Admit: 2023-11-09 | Discharge: 2023-11-09 | Disposition: A | Discharge status: Home / Self Care | Source: Ambulatory Visit

## 2023-11-09 VITALS — BP 127/87 | HR 85 | Temp 98.5°F | Resp 13 | Ht 64.25 in | Wt 262.4 lb

## 2023-11-09 DIAGNOSIS — M1380 Other specified arthritis, unspecified site: Secondary | ICD-10-CM

## 2023-11-09 DIAGNOSIS — Z79899 Other long term (current) drug therapy: Secondary | ICD-10-CM | POA: Diagnosis not present

## 2023-11-09 DIAGNOSIS — M3500 Sicca syndrome, unspecified: Secondary | ICD-10-CM

## 2023-11-09 DIAGNOSIS — Z111 Encounter for screening for respiratory tuberculosis: Secondary | ICD-10-CM

## 2023-11-09 DIAGNOSIS — R21 Rash and other nonspecific skin eruption: Secondary | ICD-10-CM

## 2023-11-09 DIAGNOSIS — Z1159 Encounter for screening for other viral diseases: Secondary | ICD-10-CM

## 2023-11-09 DIAGNOSIS — M0579 Rheumatoid arthritis with rheumatoid factor of multiple sites without organ or systems involvement: Secondary | ICD-10-CM

## 2023-11-09 MED ORDER — PREDNISONE 10 MG PO TABS: ORAL_TABLET | ORAL | 0 refills | Status: AC

## 2023-11-09 NOTE — Patient Instructions (Addendum)
 Methotrexate Tablets What is this medication? METHOTREXATE (METH oh TREX ate) treats autoimmune conditions, such as arthritis and psoriasis. It works by decreasing inflammation, which can reduce pain and prevent long-term injury to the joints and skin. It may also be used to treat some types of cancer. It works by slowing down the growth of cancer cells. This medicine may be used for other purposes; ask your health care provider or pharmacist if you have questions. COMMON BRAND NAME(S): Rheumatrex, Trexall What should I tell my care team before I take this medication? They need to know if you have any of these conditions: Dehydration Diabetes Fluid in the stomach area or lungs Frequently drink alcohol Having surgery, including dental surgery High cholesterol Immune system problems Inflammatory bowel disease, such as ulcerative colitis Kidney disease Liver disease Low blood cell levels (white cells, red cells, and platelets) Lung disease Recent or ongoing radiation Recent or upcoming vaccine Stomach ulcers, other stomach or intestine problems An unusual or allergic reaction to methotrexate, other medications, foods, dyes, or preservatives Pregnant or trying to get pregnant Breastfeeding How should I use this medication? Take this medication by mouth with water. Take it as directed on the prescription label. Do not take extra. Keep taking this medication until your care team tells you to stop. Know why you are taking this medication and how you should take it. To treat conditions such as arthritis and psoriasis, this medication is taken ONCE A WEEK as a single dose or divided into 3 smaller doses taken 12 hours apart (do not take more than 3 doses 12 hours apart each week). This medication is NEVER taken daily to treat conditions other than cancer. Taking this medication more often than directed can cause serious side effects, even death. Talk to your care team about why you are taking this  medication, how often you will take it, and what your dose is. Ask your care team to put the reason you take this medication on the prescription. If you take this medication ONCE A WEEK, choose a day of the week before you start. Ask your pharmacist to include the day of the week on the label. Avoid Monday, which could be misread as Morning. Handling this medication may be harmful. Talk to your care team about how to handle this medication. Special instructions may apply. Talk to your care team about the use of this medication in children. While it may be prescribed for selected conditions, precautions do apply. Overdosage: If you think you have taken too much of this medicine contact a poison control center or emergency room at once. NOTE: This medicine is only for you. Do not share this medicine with others. What if I miss a dose? If you miss a dose, talk with your care team. Do not take double or extra doses. What may interact with this medication? Do not take this medication with any of the following: Acitretin Live virus vaccines Probenecid This medication may also interact with the following: Alcohol Aspirin and aspirin-like medications Certain antibiotics, such as penicillin, neomycin, sulfamethoxazole; trimethoprim Certain medications for stomach problems, such as lansoprazole, omeprazole, pantoprazole Clozapine Cyclosporine Dapsone Folic acid Foscarnet NSAIDs, medications for pain and inflammation, such as ibuprofen or naproxen Phenytoin Pyrimethamine Steroid medications, such as prednisone or cortisone Tacrolimus Theophylline This list may not describe all possible interactions. Give your health care provider a list of all the medicines, herbs, non-prescription drugs, or dietary supplements you use. Also tell them if you smoke, drink alcohol, or use  illegal drugs. Some items may interact with your medicine. What should I watch for while using this medication? Visit your  care team for regular checks on your progress. It may be some time before you see the benefit from this medication. You may need blood work done while you are taking this medication. If your care team has also prescribed folic acid, they may instruct you to skip your folic acid dose on the day you take methotrexate. This medication can make you more sensitive to the sun. Keep out of the sun. If you cannot avoid being in the sun, wear protective clothing and sunscreen. Do not use sun lamps, tanning beds, or tanning booths. Check with your care team if you have severe diarrhea, nausea, and vomiting, or if you sweat a lot. The loss of too much body fluid may make it dangerous for you to take this medication. This medication may increase your risk of getting an infection. Call your care team for advice if you get a fever, chills, sore throat, or other symptoms of a cold or flu. Do not treat yourself. Try to avoid being around people who are sick. Talk to your care team about your risk of cancer. You may be more at risk for certain types of cancers if you take this medication. Talk to your care team if you or your partner may be pregnant. Serious birth defects can occur if you take this medication during pregnancy and for 6 months after the last dose. You will need a negative pregnancy test before starting this medication. Contraception is recommended while taking this medication and for 6 months after the last dose. Your care team can help you find the option that works for you. If your partner can get pregnant, use a condom during sex while taking this medication and for 3 months after the last dose. Do not breastfeed while taking this medication and for 1 week after the last dose. This medication may cause infertility. Talk to your care team if you are concerned about your fertility. What side effects may I notice from receiving this medication? Side effects that you should report to your care team as soon  as possible: Allergic reactions--skin rash, itching, hives, swelling of the face, lips, tongue, or throat Dry cough, shortness of breath or trouble breathing Infection--fever, chills, cough, sore throat, wounds that don't heal, pain or trouble when passing urine, general feeling of discomfort or being unwell Kidney injury--decrease in the amount of urine, swelling of the ankles, hands, or feet Liver injury--right upper belly pain, loss of appetite, nausea, light-colored stool, dark yellow or brown urine, yellowing skin or eyes, unusual weakness or fatigue Low red blood cell level--unusual weakness or fatigue, dizziness, headache, trouble breathing Pain, tingling, or numbness in the hands or feet, muscle weakness, change in vision, confusion or trouble speaking, loss of balance or coordination, trouble walking, seizures Redness, blistering, peeling, or loosening of the skin, including inside the mouth Stomach bleeding--bloody or black, tar-like stools, vomiting blood or brown material that looks like coffee grounds Stomach pain that is severe, does not go away, or gets worse Unusual bruising or bleeding Side effects that usually do not require medical attention (report these to your care team if they continue or are bothersome): Diarrhea Dizziness Hair loss Nausea Pain, redness, or swelling with sores inside the mouth or throat Skin reactions on sun-exposed areas Vomiting This list may not describe all possible side effects. Call your doctor for medical advice about side effects.  You may report side effects to FDA at 1-800-FDA-1088. Where should I keep my medication? Keep out of the reach of children and pets. Store at room temperature between 20 and 25 degrees C (68 and 77 degrees F). Protect from light. Keep the container tightly closed. Get rid of any unused medication after the expiration date. To get rid of medications that are no longer needed or have expired: Take the medication to a  medication take-back program. Check with your pharmacy or law enforcement to find a location. If you cannot return the medication, ask your pharmacist or care team how to get rid of this medication safely. NOTE: This sheet is a summary. It may not cover all possible information. If you have questions about this medicine, talk to your doctor, pharmacist, or health care provider.  2024 Elsevier/Gold Standard (2022-12-01 00:00:00)Vaccines You are taking a medication(s) that can suppress your immune system.  The following immunizations are recommended: Flu annually Covid-19  Td/Tdap (tetanus, diphtheria, pertussis) every 10 years Pneumonia (Prevnar 15 then Pneumovax 23 at least 1 year apart.  Alternatively, can take Prevnar 20 without needing additional dose) Shingrix: 2 doses from 4 weeks to 6 months apart  Please check with your PCP to make sure you are up to date.    If you have signs or symptoms of an infection or start antibiotics: First, call your PCP for workup of your infection. Hold your medication through the infection, until you complete your antibiotics, and until symptoms resolve if you take the following: Injectable medication (Actemra, Benlysta, Cimzia, Cosentyx, Enbrel, Humira, Kevzara, Orencia, Remicade, Simponi, Stelara, Taltz, Tremfya) Methotrexate Leflunomide (Arava) Mycophenolate (Cellcept) Earma, Olumiant, or Rinvoq    Heart Disease Prevention   Your inflammatory disease increases your risk of heart disease which includes heart attack, stroke, atrial fibrillation (irregular heartbeats), high blood pressure, heart failure and atherosclerosis (plaque in the arteries).  It is important to reduce your risk by:   Keep blood pressure, cholesterol, and blood sugar at healthy levels   Smoking Cessation   Maintain a healthy weight  BMI 20-25   Eat a healthy diet  Plenty of fresh fruit, vegetables, and whole grains  Limit saturated fats, foods high in sodium, and added  sugars  DASH and Mediterranean diet   Increase physical activity  Recommend moderate physically activity for 150 minutes per week/ 30 minutes a day for five days a week These can be broken up into three separate ten-minute sessions during the day.   Reduce Stress  Meditation, slow breathing exercises, yoga, coloring books  Dental visits twice a year     IF YOU HAVE A SURGERY SCHEDULED: Hold the medication dose that is due BEFORE your surgery if you take the following: Injectable medication (Actemra, Benlysta, Cimzia, Cosentyx, Enbrel, Humira, Kevzara, Orencia, Remicade, Rituxan, Simponi, Stelara, Taltz, Tremfya) Methotrexate Leflunomide (Arava) Mycophenolate (Cellcept) Hold 3 days before surgery: Earma, Rinvoq, Olumiant Hold 1 week before surgery: azathioprine (Imuran), mycophenolate (CellCept), methotrexate, leflunomide You do NOT have to hold: hydroxychloroquine (Plaquenil), sulfasalazine, or Otezla  AFTER YOUR SURGERY, you will need clearance by your SURGEON to resume your medication and have no signs of infection. They may send us  a note or call our clinic to provide clearance.   Standing Labs We placed an order today for your standing lab work.   Please have your standing labs drawn in 1 month  Please have your labs drawn 2 weeks prior to your appointment so that the provider can discuss your lab results  at your appointment, if possible.  Please note that you may see your imaging and lab results in MyChart before we have reviewed them. We will contact you once all results are reviewed. Please allow our office up to 72 hours to thoroughly review all of the results before contacting the office for clarification of your results.  WALK-IN LAB HOURS  Monday through Thursday from 8:00 am -12:30 pm and 1:00 pm-4:30 pm and Friday from 8:00 am-12:00 pm.  Patients with office visits requiring labs will be seen before walk-in labs.  You may encounter longer than normal wait times.  Please allow additional time. Wait times may be shorter on  Monday and Thursday afternoons.  We do not book appointments for walk-in labs. We appreciate your patience and understanding with our staff.   Labs are drawn by [facility]. Please bring your co-pay at the time of your lab draw.  You may receive a bill from [facility] for your lab work.  Please note if you are on Hydroxychloroquine and and an order has been placed for a Hydroxychloroquine level,  you will need to have it drawn 4 hours or more after your last dose.  If you wish to have your labs drawn at another location, please call the office 24 hours in advance so we can fax the orders.  The office is located at [address], [place], [place] [address]   If you have any questions regarding directions or hours of operation,  please call [phone].   As a reminder, please drink plenty of water prior to coming for your lab work. Thanks!

## 2023-11-09 NOTE — Progress Notes (Signed)
 Office Visit Note  Patient: Andrea Zamora             Date of Birth: [birth year]           MRN: [id number]             PCP: Bertell Satterfield, MD Referring: Bertell Satterfield, MD Visit Date: 11/09/2023 Occupation: Data Unavailable  Subjective:  New Patient (Initial Visit) (Joint Pain PLQ not effective enough. )  Discussed the use of AI scribe software for clinical note transcription with the patient, who gave verbal consent to proceed.  History of Present Illness Andrea Zamora is a 43 year old female with rheumatoid arthritis who presents with worsening joint pain and swelling. She was referred by Dr. Bertell for evaluation of arthritis.  She was diagnosed with rheumatoid arthritis in 2020, initially experiencing significant swelling, redness, and pain, particularly in her elbows, shoulders, and hands. Frequent steroid use for symptom management led to weight gain, reaching nearly 330 pounds, and severely impacted her mobility and daily activities. Plaquenil was started, significantly improving her symptoms, allowing weight loss and regained mobility.  Since the birth of her son last year, her symptoms have worsened, with increased joint pain and swelling, particularly in her knees, causing severe pain even at rest and during sleep. Even light pressure from a blanket or clothing can be painful. She has noticed that her symptoms worsen with the consumption of gluten, sugar, and dairy, leading her to modify her diet, which has helped with weight management.  She states she began to experience psoriasis about six months after her initial diagnosis, with patches appearing on her arms. Her teenage son has also developed psoriasis.  She has a family history of cancer and has tested positive for a BRCA mutation. She reports a history of dry mouth, sores in her mouth, and dry eyes, for which she uses special toothpaste and mouthwash. She also experiences hair loss, which worsened postpartum, and has a  history of a miscarriage in 2023 at approximately 14 weeks of gestation.  She has a history of sciatica and reports that her knees sometimes 'pop out,' causing additional pain. She experiences non-refreshing sleep and chronic fatigue, which have persisted even with adequate rest. She also experiences allodynia.   In terms of family history, her mother had several strokes, and her aunt and sister have blood clotting issues, with her sister being on blood thinners.     Activities of Daily Living:  Patient reports morning stiffness for 24 minutes.   Patient Reports nocturnal pain.  Difficulty dressing/grooming: Reports Difficulty climbing stairs: Reports Difficulty getting out of chair: Denies Difficulty using hands for taps, buttons, cutlery, and/or writing: Reports  Review of Systems  Constitutional:  Positive for fatigue.  HENT:  Positive for mouth dryness. Negative for mouth sores.   Eyes:  Negative for dryness.  Respiratory:  Positive for shortness of breath.   Cardiovascular:  Negative for chest pain and palpitations.  Gastrointestinal:  Negative for blood in stool, constipation and diarrhea.  Endocrine: Positive for increased urination.  Genitourinary:  Negative for involuntary urination.  Musculoskeletal:  Positive for joint pain, gait problem, joint pain, joint swelling, muscle weakness and morning stiffness. Negative for myalgias, muscle tenderness and myalgias.  Skin:  Negative for color change, rash, hair loss and sensitivity to sunlight.  Allergic/Immunologic: Positive for susceptible to infections.  Neurological:  Positive for headaches. Negative for dizziness.  Hematological:  Negative for swollen glands.  Psychiatric/Behavioral:  Negative for depressed mood and sleep  disturbance. The patient is nervous/anxious.     PMFS History:  Patient Active Problem List   Diagnosis Date Noted   Morbid obesity (HCC) 07/23/2020   Numbness of lower limb 07/23/2020   Paraparesis  (HCC) 07/23/2020   Inflammatory arthritis 07/23/2020   Polyarthritis 07/23/2020   Vitamin D deficiency 07/23/2020   Body mass index (BMI) 40.0-44.9, adult (HCC) 06/22/2020   Bilateral arm weakness 04/12/2020   Bilateral arm numbness and tingling while sleeping 04/12/2020   Degeneration of lumbar intervertebral disc 11/06/2019   Rheumatoid arthritis involving multiple sites with positive rheumatoid factor (HCC) 11/06/2019   Elevated blood-pressure reading, without diagnosis of hypertension 08/14/2019   Lipoma of head 08/14/2019   Osteoarthritis of spine with radiculopathy, cervical region 08/14/2019   Asthma 04/21/2016   Fibromyalgia 04/21/2016   Displacement of lumbar intervertebral disc without myelopathy 11/04/2012    Past Medical History:  Diagnosis Date   Asthma    Cervical radiculitis    Chronic pain    Hypertension    Obesity     Family History  Problem Relation Age of Onset   Stroke Mother    Cancer Father    Alcohol abuse Father    Drug abuse Father    Hypertension Sister    Diabetes Sister    Heart disease Sister    Hypertension Sister    Stroke Paternal Grandmother    Thyroid disease Paternal Grandmother    Migraines Son    ADD / ADHD Son    OCD Son    Migraines Daughter    Past Surgical History:  Procedure Laterality Date   CESAREAN SECTION  04/18/2000   CESAREAN SECTION  05/23/2007   CESAREAN SECTION  07/19/2022   CHOLECYSTECTOMY  03/2000   SALPINGECTOMY Bilateral 07/19/2022   Social History   Tobacco Use   Smoking status: Never    Passive exposure: Past   Smokeless tobacco: Never  Vaping Use   Vaping status: Never Used  Substance Use Topics   Alcohol use: Never   Drug use: Never   Social History   Social History Narrative   Lives with family     Immunization History  Administered Date(s) Administered   MMR 05/23/2007   Tdap 05/23/2007     Objective: Vital Signs: BP 127/87 (BP Location: Right Arm, Patient Position: Sitting, Cuff  Size: Large)   Pulse 85   Temp 98.5 F (36.9 C)   Resp 13   Ht 5' 4.25 (1.632 m)   Wt 262 lb 6.4 oz (119 kg)   LMP 10/10/2023   BMI 44.69 kg/m    Physical Exam   Musculoskeletal Exam:   CDAI Exam: CDAI Score: -- Patient Global: --; Provider Global: -- Swollen: 6 ; Tender: 6  Joint Exam 11/09/2023      Right  Left  Elbow  Swollen Tender  Swollen Tender  MCP 2  Swollen Tender  Swollen Tender  MCP 3  Swollen Tender  Swollen Tender     Investigation: No additional findings.  Imaging: No results found.  Recent Labs: No results found for: WBC, HGB, PLT, NA, K, CL, CO2, GLUCOSE, BUN, CREATININE, BILITOT, ALKPHOS, AST, ALT, PROT, ALBUMIN, CALCIUM, GFRAA, QFTBGOLD, QFTBGOLDPLUS  Speciality Comments: No specialty comments available.  Procedures:  No procedures performed Allergies: Pregabalin, Hydrocodone, Hydrocodone-acetaminophen, Nucynta  [tapentadol hcl], Nucynta [tapentadol], Sulfamethoxazole-trimethoprim, Tape, Metoclopramide, Nitrofurantoin, Prochlorperazine edisylate, and Tramadol   Assessment / Plan:     Visit Diagnoses:  Inflammatory arthritis  Sicca symptoms Psoriasis Patient with prior dx  of RA, w/ symptoms suggestive of inflammatory arthritis at this time (synovitis, prolonged AM stiffness). She states there were questions concerning her initial diagnosis, however I cannot view labs obtained at that time that lead to some concerns over the etiology of her joint symptoms.  As discussed with patient, I do suspect her joint pain is multifactorial in nature, including a component of inflammatory arthritis, as well as osteoarthritis. Given no baseline labs from initial diagnosis are available, will obtain ANA, Sjogren's syndrome antibods(ssa + ssb), Sedimentation rate, C-reactive protein, Anti-DNA antibody, double-stranded, Anti-Smith antibody, C3 and C4, HLA-B27 antigen today to evaluate all possible causes. Will also obtain  baseline XR's hand, feet, and knees today.  Patient currently on HCQ 400mg  daily, will continue that dose at this time. Patient states she has not yet underwent HCQ eye exam. Patient educated on risks and need to obtain one as soon as possible. Pending work-up, will likely start Methotrexate since it would be beneficial regardless of the etiology of her inflammatory arthritis.  Discussed with patient that Methotrexate (MTX) should be taken once weekly, the same day of the week (ie every Friday, for example). Taking daily doses can be very dangerous. The most common side effects can include GI upset, hair loss, sores in the mouth, diarrhea, fatigue around the day of and after dosing. Uncommonly, MTX can cause cirrhosis of the liver or lung problems. It can also cause blood counts to be low and can increase risk for infection. It is important to monitor labs every 3 months to identify any problems that could be developing. Folic acid, a B-vitamin should be taken every day to help prevent some of the common side effects of methotrexate (ie ulcers, hair loss). It is important to be up to date with vaccines and to notify your doctor if any infections. Patient was advised to hold medication if significantly ill or on an antibiotic. Also discussed that alcohol use should be avoided when taking MTX as this may increase the risk of liver damage.   Women should not become pregnant while taking MTX and use a reliable source of birth control such as an IUD, since methotrexate can cause birth defects. Women who desire to become pregnant should speak with their rheumatologist so that this can be safely planned off of methotrexate.   QuantiFERON and hepatitis will be checked. ACR handout given to patient to review and encouraged to ask questions. Patient advised they will need blood tests for liver function, kidney function, and blood counts q2 weeks while adjusting dosage, and then every three months while taking this  medication.   Patient also given prednisone taper. Discussed steroid side effects including weight gain, decreased bone density/fractures, risk for infection, avascular necrosis, hypertension, hyperglycemia, dyspepsia/gastric ulcers, fluid retention, weakness, bruising, acne, cataracts, insomnia, mood problems. Patient also instructed to take this medication with food, and not to take this medication with NSAIDs such as Aleve or Ibuprofen.    High risk medication use Patient on HCQ and starting Methotrexate for RA. Will obtain baseline CBC, AST, ALT, Creatinine, serum today. Will also obtain hepatitis panel and TB test today.  Fibromyalgia  The patient notes constant fatigue and widespread pain, trouble concentrating (when reading a newspaper or watching TV), memory deficits with mental fog, and nonrejuvenating sleep. The patient has a history of widespread pain lasting more than 3 months. This is consistent with a diagnosis of fibromyalgia.  Fibromyalgia is a chronic pain disorder, thought to be a disorder of pain regulation, and  often classified as a form of central sensitization. Fibromyalgia is characterized by widespread musculoskeletal pain, accompanied by fatigue, cognitive disturbances, psychiatric symptoms, headaches, paresthesia's, and multiple somatic symptoms. Fibromyalgia is not an inflammatory or autoimmune condition, and immunosuppressive therapy does not treat fibromyalgia. Treatment is both nonpharmacological and pharmacological in nature, and should focus on sleep hygiene, graded exercise programs, pharmacologic therapy (options including TCA (amitriptyline), SNRIs (duloxetine, milnacipran), flexeril, gabapentin/lyrica, naltrexone, TENS), pyschosocial therapy i.e. CBT. The patient will likely benefit from multidisciplinary care by PCP, PT, integrative medicine, psychiatry, and pain management. These recommendations were discussed with the patient.   Orders: Orders Placed This Encounter   Procedures   DG Hand 2 View Right   DG Hand 2 View Left   DG Foot 2 Views Right   DG Foot 2 Views Left   Hepatitis B core antibody, IgM   Hepatitis B surface antigen   Hepatitis C antibody   QuantiFERON-TB Gold Plus   Rheumatoid Factor   Cyclic citrul peptide antibody, IgG   CBC   AST   ALT   Creatinine, serum   ANA   Sjogren's syndrome antibods(ssa + ssb)   Sedimentation rate   C-reactive protein   Anti-DNA antibody, double-stranded   Anti-Smith antibody   C3 and C4   HLA-B27 antigen   Meds ordered this encounter  Medications   predniSONE (DELTASONE) 10 MG tablet    Sig: Take 2 tablets (20 mg total) by mouth daily with breakfast for 3 days, THEN 1.5 tablets (15 mg total) daily with breakfast for 3 days, THEN 1 tablet (10 mg total) daily with breakfast for 3 days, THEN 0.5 tablets (5 mg total) daily with breakfast for 3 days.    Dispense:  15 tablet    Refill:  0     I personally spent a total of 60 minutes in the care of the patient today including preparing to see the patient, getting/reviewing separately obtained history, performing a medically appropriate exam/evaluation, counseling and educating, placing orders, and documenting clinical information in the EHR.   Follow-Up Instructions: Return in about 3 months (around 02/09/2024).   Asberry Claw, DO  Note - This record has been created using Animal nutritionist.  Chart creation errors have been sought, but may not always  have been located. Such creation errors do not reflect on  the standard of medical care.

## 2023-11-12 ENCOUNTER — Ambulatory Visit: Payer: Self-pay

## 2023-11-12 DIAGNOSIS — Z79899 Other long term (current) drug therapy: Secondary | ICD-10-CM

## 2023-11-12 MED ORDER — METHOTREXATE SODIUM 2.5 MG PO TABS: ORAL_TABLET | ORAL | 0 refills | Status: DC

## 2023-11-12 MED ORDER — FOLIC ACID 1 MG PO TABS: 1.0000 mg | ORAL_TABLET | Freq: Every day | ORAL | 3 refills | Status: AC

## 2023-11-13 LAB — CBC
HCT: 41.1 % (ref 35.0–45.0)
Hemoglobin: 13.9 g/dL (ref 11.7–15.5)
MCH: 28.3 pg (ref 27.0–33.0)
MCHC: 33.8 g/dL (ref 32.0–36.0)
MCV: 83.5 fL (ref 80.0–100.0)
MPV: 10.4 fL (ref 7.5–12.5)
Platelets: 287 Thousand/uL (ref 140–400)
RBC: 4.92 Million/uL (ref 3.80–5.10)
RDW: 13.8 % (ref 11.0–15.0)
WBC: 7.8 Thousand/uL (ref 3.8–10.8)

## 2023-11-13 LAB — ANTI-SMITH ANTIBODY: ENA SM Ab Ser-aCnc: <1 AI

## 2023-11-13 LAB — QUANTIFERON-TB GOLD PLUS
Mitogen-NIL: 6.59 [IU]/mL
NIL: 0.01 [IU]/mL
QuantiFERON-TB Gold Plus: NEGATIVE
TB1-NIL: 0 [IU]/mL
TB2-NIL: 0 [IU]/mL

## 2023-11-13 LAB — CYCLIC CITRUL PEPTIDE ANTIBODY, IGG: Cyclic Citrullin Peptide Ab: <16 U

## 2023-11-13 LAB — ANA: Anti Nuclear Antibody (ANA): POSITIVE — AB

## 2023-11-13 LAB — SJOGREN'S SYNDROME ANTIBODS(SSA + SSB)
SSA (Ro) (ENA) Antibody, IgG: <1 AI
SSB (La) (ENA) Antibody, IgG: <1 AI

## 2023-11-13 LAB — HEPATITIS B SURFACE ANTIGEN: Hepatitis B Surface Ag: NONREACTIVE

## 2023-11-13 LAB — AST: AST: 22 U/L (ref 10–30)

## 2023-11-13 LAB — ALT: ALT: 20 U/L (ref 6–29)

## 2023-11-13 LAB — C3 AND C4
C3 Complement: 167 mg/dL (ref 83–193)
C4 Complement: 21 mg/dL (ref 15–57)

## 2023-11-13 LAB — SEDIMENTATION RATE: Sed Rate: 14 mm/h (ref 0–20)

## 2023-11-13 LAB — C-REACTIVE PROTEIN: CRP: <3 mg/L (ref <8.0)

## 2023-11-13 LAB — ANTI-NUCLEAR AB-TITER (ANA TITER): ANA Titer 1: 1:40 {titer} — ABNORMAL HIGH

## 2023-11-13 LAB — HEPATITIS C ANTIBODY: Hepatitis C Ab: NONREACTIVE

## 2023-11-13 LAB — RHEUMATOID FACTOR: Rheumatoid fact SerPl-aCnc: 10 [IU]/mL (ref <14)

## 2023-11-13 LAB — CREATININE, SERUM: Creat: 0.73 mg/dL (ref 0.50–0.99)

## 2023-11-13 LAB — HEPATITIS B CORE ANTIBODY, IGM: Hep B C IgM: NONREACTIVE

## 2023-11-13 LAB — ANTI-DNA ANTIBODY, DOUBLE-STRANDED: ds DNA Ab: 2 [IU]/mL

## 2023-11-13 LAB — HLA-B27 ANTIGEN: HLA-B27 Antigen: NEGATIVE

## 2023-11-27 ENCOUNTER — Telehealth: Payer: Self-pay

## 2023-11-27 NOTE — Telephone Encounter (Signed)
 Patient called the office to see if she was supposed to get her labs done before or after her second dose of methotrexate . Advised patient that she should get the labs after her second dose of the medication.

## 2023-12-06 ENCOUNTER — Other Ambulatory Visit: Payer: Self-pay | Admitting: *Deleted

## 2023-12-06 DIAGNOSIS — Z79899 Other long term (current) drug therapy: Secondary | ICD-10-CM

## 2023-12-07 LAB — CBC WITH DIFFERENTIAL/PLATELET
Absolute Lymphocytes: 1528 {cells}/uL (ref 850–3900)
Absolute Monocytes: 1040 {cells}/uL — ABNORMAL HIGH (ref 200–950)
Basophils Absolute: 16 {cells}/uL (ref 0–200)
Basophils Relative: 0.2 %
Eosinophils Absolute: 240 {cells}/uL (ref 15–500)
Eosinophils Relative: 3 %
HCT: 40.8 % (ref 35.9–46.0)
Hemoglobin: 13.5 g/dL (ref 11.7–15.5)
MCH: 28 pg (ref 27.0–33.0)
MCHC: 33.1 g/dL (ref 31.6–35.4)
MCV: 84.6 fL (ref 81.4–101.7)
MPV: 10.6 fL (ref 7.5–12.5)
Monocytes Relative: 13 %
Neutro Abs: 5176 {cells}/uL (ref 1500–7800)
Neutrophils Relative %: 64.7 %
Platelets: 306 Thousand/uL (ref 140–400)
RBC: 4.82 Million/uL (ref 3.80–5.10)
RDW: 13.4 % (ref 11.0–15.0)
Total Lymphocyte: 19.1 %
WBC: 8 Thousand/uL (ref 3.8–10.8)

## 2023-12-07 LAB — ALT: ALT: 19 U/L (ref 6–29)

## 2023-12-07 LAB — AST: AST: 20 U/L (ref 10–30)

## 2023-12-07 LAB — CREATININE, SERUM: Creat: 0.76 mg/dL (ref 0.50–0.99)

## 2023-12-11 ENCOUNTER — Other Ambulatory Visit (HOSPITAL_COMMUNITY): Payer: Self-pay | Admitting: Nurse Practitioner

## 2023-12-11 DIAGNOSIS — R011 Cardiac murmur, unspecified: Secondary | ICD-10-CM

## 2023-12-14 ENCOUNTER — Other Ambulatory Visit: Payer: Self-pay

## 2023-12-14 MED ORDER — METHOTREXATE SODIUM 2.5 MG PO TABS: 15.0000 mg | ORAL_TABLET | ORAL | 0 refills | Status: DC

## 2023-12-24 ENCOUNTER — Ambulatory Visit: Attending: Cardiology

## 2023-12-24 DIAGNOSIS — R011 Cardiac murmur, unspecified: Secondary | ICD-10-CM | POA: Diagnosis not present

## 2023-12-25 LAB — ECHOCARDIOGRAM COMPLETE
AR max vel: 2.58 cm2
AV Area VTI: 2.56 cm2
AV Area mean vel: 2.36 cm2
AV Mean grad: 9.8 mmHg
AV Peak grad: 17.9 mmHg
Ao pk vel: 2.11 m/s
Area-P 1/2: 6.32 cm2
Calc EF: 66.9 %
S' Lateral: 2.1 cm
Single Plane A2C EF: 59.6 %
Single Plane A4C EF: 73.7 %

## 2023-12-31 ENCOUNTER — Ambulatory Visit: Payer: Self-pay

## 2024-01-15 ENCOUNTER — Ambulatory Visit: Admitting: Cardiology

## 2024-01-15 ENCOUNTER — Ambulatory Visit

## 2024-01-15 ENCOUNTER — Other Ambulatory Visit: Payer: Self-pay | Admitting: *Deleted

## 2024-01-15 DIAGNOSIS — Z79899 Other long term (current) drug therapy: Secondary | ICD-10-CM

## 2024-01-15 NOTE — Progress Notes (Unsigned)
 "     Clinical Summary Andrea Zamora is a 44 y.o.female  1.Heart murmur - 12/2023 echo: LVEF 70-75%, no WMAs, normal valves  Past Medical History:  Diagnosis Date   Asthma    Cervical radiculitis    Chronic pain    Hypertension    Obesity      Allergies[1]   Current Outpatient Medications  Medication Sig Dispense Refill   albuterol (PROVENTIL) 2 MG tablet Take 2 mg by mouth 3 (three) times daily.  11   albuterol (VENTOLIN HFA) 108 (90 Base) MCG/ACT inhaler Inhale into the lungs every 6 (six) hours as needed.     cetirizine (ZYRTEC) 10 MG chewable tablet Chew 10 mg by mouth daily.     Ferrous Sulfate (IRON PO) Take 1 tablet by mouth every evening.     folic acid  (FOLVITE ) 1 MG tablet Take 1 tablet (1 mg total) by mouth daily. 90 tablet 3   gabapentin (NEURONTIN) 600 MG tablet Take 600 mg by mouth 3 (three) times daily. (Patient taking differently: Take 600 mg by mouth 6 (six) times daily.)     hydroxychloroquine (PLAQUENIL) 200 MG tablet Take 200 mg by mouth 2 (two) times daily.     Magnesium 250 MG CAPS      methotrexate  (RHEUMATREX) 2.5 MG tablet Take 6 tablets (15 mg total) by mouth once a week. Take 4 tabs po weekly x 2 weeks. If labs are stable increase to 6 tabs po weekly.  Caution:Chemotherapy. Protect from light. 24 tablet 0   montelukast (SINGULAIR) 10 MG tablet 10 mg daily.     morphine (MS CONTIN) 30 MG 12 hr tablet Take 30 mg by mouth daily as needed.     ondansetron (ZOFRAN) 4 MG tablet Take 4 mg by mouth as needed.     pantoprazole (PROTONIX) 40 MG tablet Take 40 mg by mouth daily.  11   phentermine 37.5 MG capsule Take 37.5 mg by mouth every morning.     Potassium 99 MG TABS      rizatriptan  (MAXALT -MLT) 10 MG disintegrating tablet Take 1 tablet (10 mg total) by mouth as needed for migraine. May repeat in 2 hours if needed 9 tablet 11   valACYclovir (VALTREX) 500 MG tablet Take 500 mg by mouth daily.  11   No current facility-administered medications for this  visit.     Past Surgical History:  Procedure Laterality Date   CESAREAN SECTION  04/18/2000   CESAREAN SECTION  05/23/2007   CESAREAN SECTION  07/19/2022   CHOLECYSTECTOMY  03/2000   SALPINGECTOMY Bilateral 07/19/2022     Allergies[2]    Family History  Problem Relation Age of Onset   Stroke Mother    Cancer Father    Alcohol abuse Father    Drug abuse Father    Hypertension Sister    Diabetes Sister    Heart disease Sister    Hypertension Sister    Stroke Paternal Grandmother    Thyroid disease Paternal Grandmother    Migraines Son    ADD / ADHD Son    OCD Son    Migraines Daughter      Social History Andrea Zamora reports that she has never smoked. She has been exposed to tobacco smoke. She has never used smokeless tobacco. Andrea Zamora reports no history of alcohol use.   Review of Systems CONSTITUTIONAL: No weight loss, fever, chills, weakness or fatigue.  HEENT: Eyes: No visual loss, blurred vision, double vision or yellow sclerae.No hearing loss,  sneezing, congestion, runny nose or sore throat.  SKIN: No rash or itching.  CARDIOVASCULAR:  RESPIRATORY: No shortness of breath, cough or sputum.  GASTROINTESTINAL: No anorexia, nausea, vomiting or diarrhea. No abdominal pain or blood.  GENITOURINARY: No burning on urination, no polyuria NEUROLOGICAL: No headache, dizziness, syncope, paralysis, ataxia, numbness or tingling in the extremities. No change in bowel or bladder control.  MUSCULOSKELETAL: No muscle, back pain, joint pain or stiffness.  LYMPHATICS: No enlarged nodes. No history of splenectomy.  PSYCHIATRIC: No history of depression or anxiety.  ENDOCRINOLOGIC: No reports of sweating, cold or heat intolerance. No polyuria or polydipsia.  Andrea Zamora   Physical Examination There were no vitals filed for this visit. There were no vitals filed for this visit.  Gen: resting comfortably, no acute distress HEENT: no scleral icterus, pupils equal round and reactive, no  palptable cervical adenopathy,  CV Resp: Clear to auscultation bilaterally GI: abdomen is soft, non-tender, non-distended, normal bowel sounds, no hepatosplenomegaly MSK: extremities are warm, no edema.  Skin: warm, no rash Neuro:  no focal deficits Psych: appropriate affect   Diagnostic Studies     Assessment and Plan        Andrea Zamora, M.D., F.A.C.C.    [1]  Allergies Allergen Reactions   Pregabalin Swelling   Hydrocodone     Other reaction(s): Unknown   Hydrocodone-Acetaminophen    Nucynta  [Tapentadol Hcl]    Nucynta [Tapentadol]     Other reaction(s): Unknown   Sulfamethoxazole-Trimethoprim    Tape Other (See Comments)    All tapes/bandaids cause skin irritation and will tear skin   Metoclopramide Itching and Rash   Nitrofurantoin Itching    Other reaction(s): Unknown   Prochlorperazine Edisylate Itching and Rash   Tramadol Other (See Comments)    Stomach pain   [2]  Allergies Allergen Reactions   Pregabalin Swelling   Hydrocodone     Other reaction(s): Unknown   Hydrocodone-Acetaminophen    Nucynta  [Tapentadol Hcl]    Nucynta [Tapentadol]     Other reaction(s): Unknown   Sulfamethoxazole-Trimethoprim    Tape Other (See Comments)    All tapes/bandaids cause skin irritation and will tear skin   Metoclopramide Itching and Rash   Nitrofurantoin Itching    Other reaction(s): Unknown   Prochlorperazine Edisylate Itching and Rash   Tramadol Other (See Comments)    Stomach pain    "

## 2024-01-16 LAB — CBC WITH DIFFERENTIAL/PLATELET
Absolute Lymphocytes: 1564 {cells}/uL (ref 850–3900)
Absolute Monocytes: 751 {cells}/uL (ref 200–950)
Basophils Absolute: 32 {cells}/uL (ref 0–200)
Basophils Relative: 0.4 %
Eosinophils Absolute: 198 {cells}/uL (ref 15–500)
Eosinophils Relative: 2.5 %
HCT: 37.8 % (ref 35.9–46.0)
Hemoglobin: 12.4 g/dL (ref 11.7–15.5)
MCH: 28 pg (ref 27.0–33.0)
MCHC: 32.8 g/dL (ref 31.6–35.4)
MCV: 85.3 fL (ref 81.4–101.7)
MPV: 10.3 fL (ref 7.5–12.5)
Monocytes Relative: 9.5 %
Neutro Abs: 5356 {cells}/uL (ref 1500–7800)
Neutrophils Relative %: 67.8 %
Platelets: 262 Thousand/uL (ref 140–400)
RBC: 4.43 Million/uL (ref 3.80–5.10)
RDW: 13 % (ref 11.0–15.0)
Total Lymphocyte: 19.8 %
WBC: 7.9 Thousand/uL (ref 3.8–10.8)

## 2024-01-16 LAB — CREATININE, SERUM: Creat: 0.68 mg/dL (ref 0.50–0.99)

## 2024-01-16 LAB — AST: AST: 21 U/L (ref 10–30)

## 2024-01-16 LAB — ALT: ALT: 23 U/L (ref 6–29)

## 2024-01-24 ENCOUNTER — Telehealth: Payer: Self-pay

## 2024-01-24 MED ORDER — METHOTREXATE SODIUM 2.5 MG PO TABS: 15.0000 mg | ORAL_TABLET | ORAL | 0 refills | Status: DC

## 2024-01-24 MED ORDER — METHOTREXATE SODIUM 2.5 MG PO TABS: 15.0000 mg | ORAL_TABLET | ORAL | 0 refills | Status: AC

## 2024-01-24 NOTE — Telephone Encounter (Signed)
 Patient contacted the office and states she had her labs done almost a couple of weeks ago. Patient states she has not heard anything about her results. Patient states she is on methotrexate  and was possibly increasing the dose but has not had anymore medication sent in. Patient states she would like to hear about her results and medication. Patient states her pharmacy is [facility]. Please advise.

## 2024-01-24 NOTE — Telephone Encounter (Signed)
 Reached out to patient and apologized for the delay. Advised patient labs looked great, blood cell counts, liver function, and kidney function are normal. Patient advised to continue Methotrexate  6 tablets weekly a this time with daily folic acid .

## 2024-01-24 NOTE — Addendum Note (Signed)
 Addended by: LUBA STABS A on: 01/24/2024 02:55 PM   Modules accepted: Orders

## 2024-01-29 NOTE — Progress Notes (Unsigned)
 "  Office Visit Note  Patient: Andrea Zamora             Date of Birth: 1980/01/16           MRN: [id number]             PCP: Bertell Satterfield, MD Referring: Bertell Satterfield, MD Visit Date: 02/11/2024 Occupation: Data Unavailable  Subjective:  No chief complaint on file.   History of Present Illness: Andrea Zamora is a 44 y.o. female with inflammatory arthritis, sicca symptoms, psoriasis and Fibromyalgia who is presenting for a 3 month follow up. She was last seen on 11/09/2023 where she was started on MTX and advised to continue HCQ.     Activities of Daily Living:  Patient reports morning stiffness for *** {minute/hour:19697}.   Patient {ACTIONS;DENIES/REPORTS:21021675::Denies} nocturnal pain.  Difficulty dressing/grooming: {ACTIONS;DENIES/REPORTS:21021675::Denies} Difficulty climbing stairs: {ACTIONS;DENIES/REPORTS:21021675::Denies} Difficulty getting out of chair: {ACTIONS;DENIES/REPORTS:21021675::Denies} Difficulty using hands for taps, buttons, cutlery, and/or writing: {ACTIONS;DENIES/REPORTS:21021675::Denies}  No Rheumatology ROS completed.   PMFS History:  Patient Active Problem List   Diagnosis Date Noted   Morbid obesity (HCC) 07/23/2020   Numbness of lower limb 07/23/2020   Paraparesis (HCC) 07/23/2020   Inflammatory arthritis 07/23/2020   Polyarthritis 07/23/2020   Vitamin D deficiency 07/23/2020   Body mass index (BMI) 40.0-44.9, adult (HCC) 06/22/2020   Bilateral arm weakness 04/12/2020   Bilateral arm numbness and tingling while sleeping 04/12/2020   Degeneration of lumbar intervertebral disc 11/06/2019   Rheumatoid arthritis involving multiple sites with positive rheumatoid factor (HCC) 11/06/2019   Elevated blood-pressure reading, without diagnosis of hypertension 08/14/2019   Lipoma of head 08/14/2019   Osteoarthritis of spine with radiculopathy, cervical region 08/14/2019   Asthma 04/21/2016   Fibromyalgia 04/21/2016   Displacement of lumbar  intervertebral disc without myelopathy 11/04/2012    Past Medical History:  Diagnosis Date   Asthma    Cervical radiculitis    Chronic pain    Hypertension    Obesity     Family History  Problem Relation Age of Onset   Stroke Mother    Cancer Father    Alcohol abuse Father    Drug abuse Father    Hypertension Sister    Diabetes Sister    Heart disease Sister    Hypertension Sister    Stroke Paternal Grandmother    Thyroid disease Paternal Grandmother    Migraines Son    ADD / ADHD Son    OCD Son    Migraines Daughter    Past Surgical History:  Procedure Laterality Date   CESAREAN SECTION  04/18/2000   CESAREAN SECTION  05/23/2007   CESAREAN SECTION  07/19/2022   CHOLECYSTECTOMY  03/2000   SALPINGECTOMY Bilateral 07/19/2022   Social History[1] Social History   Social History Narrative   Lives with family     Immunization History  Administered Date(s) Administered   MMR 05/23/2007   Tdap 05/23/2007     Objective: Vital Signs: There were no vitals taken for this visit.   Physical Exam   Musculoskeletal Exam: ***  CDAI Exam: CDAI Score: -- Patient Global: --; Provider Global: -- Swollen: --; Tender: -- Joint Exam 02/11/2024   No joint exam has been documented for this visit   There is currently no information documented on the homunculus. Go to the Rheumatology activity and complete the homunculus joint exam.  Investigation: No additional findings.  Imaging: No results found.  Recent Labs: Lab Results  Component Value Date   WBC 7.9 01/15/2024  HGB 12.4 01/15/2024   PLT 262 01/15/2024   CREATININE 0.68 01/15/2024   AST 21 01/15/2024   ALT 23 01/15/2024   QFTBGOLDPLUS NEGATIVE 11/09/2023    Speciality Comments: No specialty comments available.  Procedures:  No procedures performed Allergies: Pregabalin, Hydrocodone, Hydrocodone-acetaminophen, Nucynta  [tapentadol hcl], Nucynta [tapentadol], Sulfamethoxazole-trimethoprim, Tape,  Metoclopramide, Nitrofurantoin, Prochlorperazine edisylate, and Tramadol   Assessment / Plan:     Visit Diagnoses: No diagnosis found.  Orders: No orders of the defined types were placed in this encounter.  No orders of the defined types were placed in this encounter.   Face-to-face time spent with patient was *** minutes. Greater than 50% of time was spent in counseling and coordination of care.  Follow-Up Instructions: No follow-ups on file.   Alfonso Patterson, LPN  Note - This record has been created using [employer].  Chart creation errors have been sought, but may not always  have been located. Such creation errors do not reflect on  the standard of medical care.     [1]  Social History Tobacco Use   Smoking status: Never    Passive exposure: Past   Smokeless tobacco: Never  Vaping Use   Vaping status: Never Used  Substance Use Topics   Alcohol use: Never   Drug use: Never   "

## 2024-02-11 ENCOUNTER — Ambulatory Visit

## 2024-02-11 DIAGNOSIS — M1380 Other specified arthritis, unspecified site: Secondary | ICD-10-CM

## 2024-02-11 DIAGNOSIS — Z79899 Other long term (current) drug therapy: Secondary | ICD-10-CM

## 2024-02-11 DIAGNOSIS — M3500 Sicca syndrome, unspecified: Secondary | ICD-10-CM
